# Patient Record
Sex: Male | Born: 1960 | Race: Black or African American | Hispanic: No | Marital: Married | Smoking: Current some day smoker
Health system: Southern US, Community
[De-identification: ages and names within clinical notes are randomized; demographics above are authoritative.]

## PROBLEM LIST (undated history)

## (undated) DIAGNOSIS — M1990 Unspecified osteoarthritis, unspecified site: Secondary | ICD-10-CM

## (undated) DIAGNOSIS — E785 Hyperlipidemia, unspecified: Secondary | ICD-10-CM

## (undated) HISTORY — PX: ARTHOSCOPIC ROTAOR CUFF REPAIR: SHX5002

## (undated) HISTORY — DX: Hyperlipidemia, unspecified: E78.5

## (undated) HISTORY — DX: Unspecified osteoarthritis, unspecified site: M19.90

---

## 2000-05-22 ENCOUNTER — Encounter: Payer: Self-pay | Admitting: Orthopedic Surgery

## 2000-05-22 ENCOUNTER — Encounter: Admission: RE | Admit: 2000-05-22 | Discharge: 2000-05-22 | Payer: Self-pay | Admitting: Orthopedic Surgery

## 2001-05-01 ENCOUNTER — Encounter: Payer: Self-pay | Admitting: Emergency Medicine

## 2001-05-01 ENCOUNTER — Emergency Department (HOSPITAL_COMMUNITY): Admission: EM | Admit: 2001-05-01 | Discharge: 2001-05-02 | Payer: Self-pay | Admitting: Emergency Medicine

## 2001-05-02 ENCOUNTER — Emergency Department (HOSPITAL_COMMUNITY): Admission: EM | Admit: 2001-05-02 | Discharge: 2001-05-02 | Payer: Self-pay | Admitting: Emergency Medicine

## 2005-09-05 ENCOUNTER — Encounter: Admission: RE | Admit: 2005-09-05 | Discharge: 2005-09-05 | Payer: Self-pay | Admitting: Orthopedic Surgery

## 2005-09-07 ENCOUNTER — Encounter: Admission: RE | Admit: 2005-09-07 | Discharge: 2005-09-07 | Payer: Self-pay | Admitting: Orthopedic Surgery

## 2005-10-18 ENCOUNTER — Emergency Department (HOSPITAL_COMMUNITY): Admission: EM | Admit: 2005-10-18 | Discharge: 2005-10-18 | Payer: Self-pay | Admitting: Emergency Medicine

## 2006-01-10 ENCOUNTER — Ambulatory Visit (HOSPITAL_COMMUNITY): Admission: RE | Admit: 2006-01-10 | Discharge: 2006-01-10 | Payer: Self-pay | Admitting: Orthopedic Surgery

## 2010-11-17 ENCOUNTER — Encounter: Payer: Self-pay | Admitting: Family Medicine

## 2011-06-19 ENCOUNTER — Ambulatory Visit (INDEPENDENT_AMBULATORY_CARE_PROVIDER_SITE_OTHER): Payer: PRIVATE HEALTH INSURANCE | Admitting: Internal Medicine

## 2011-06-19 ENCOUNTER — Other Ambulatory Visit (INDEPENDENT_AMBULATORY_CARE_PROVIDER_SITE_OTHER): Payer: PRIVATE HEALTH INSURANCE

## 2011-06-19 ENCOUNTER — Other Ambulatory Visit: Payer: Self-pay | Admitting: Internal Medicine

## 2011-06-19 ENCOUNTER — Encounter: Payer: Self-pay | Admitting: Internal Medicine

## 2011-06-19 VITALS — BP 120/80 | HR 83 | Temp 98.7°F | Resp 16 | Ht 68.0 in | Wt 182.8 lb

## 2011-06-19 DIAGNOSIS — R5383 Other fatigue: Secondary | ICD-10-CM | POA: Insufficient documentation

## 2011-06-19 DIAGNOSIS — Z23 Encounter for immunization: Secondary | ICD-10-CM

## 2011-06-19 DIAGNOSIS — Z0000 Encounter for general adult medical examination without abnormal findings: Secondary | ICD-10-CM

## 2011-06-19 LAB — COMPREHENSIVE METABOLIC PANEL
ALT: 33 U/L (ref 0–53)
AST: 28 U/L (ref 0–37)
Albumin: 4.3 g/dL (ref 3.5–5.2)
CO2: 28 mEq/L (ref 19–32)
Calcium: 9.3 mg/dL (ref 8.4–10.5)
Chloride: 103 mEq/L (ref 96–112)
GFR: 101.87 mL/min (ref >60.00)
Potassium: 4.3 mEq/L (ref 3.5–5.1)
Sodium: 138 mEq/L (ref 135–145)
Total Protein: 7.3 g/dL (ref 6.0–8.3)

## 2011-06-19 LAB — CBC WITH DIFFERENTIAL/PLATELET
Eosinophils Relative: 2.2 % (ref 0.0–5.0)
HCT: 39.9 % (ref 39.0–52.0)
Lymphocytes Relative: 23.4 % (ref 12.0–46.0)
Monocytes Relative: 9.7 % (ref 3.0–12.0)
Neutrophils Relative %: 64.3 % (ref 43.0–77.0)
Platelets: 281 10*3/uL (ref 150.0–400.0)
WBC: 5.7 10*3/uL (ref 4.5–10.5)

## 2011-06-19 LAB — LIPID PANEL
HDL: 43.2 mg/dL (ref >39.00)
Total CHOL/HDL Ratio: 6
Triglycerides: 398 mg/dL — ABNORMAL HIGH (ref 0.0–149.0)

## 2011-06-19 LAB — LDL CHOLESTEROL, DIRECT: Direct LDL: 123 mg/dL

## 2011-06-19 NOTE — Assessment & Plan Note (Signed)
Check labs to look for secondary causes

## 2011-06-19 NOTE — Progress Notes (Signed)
Subjective:    Patient ID: Alec Chavez, male    DOB: 10/29/1960, 50 y.o.   MRN: [id number]  HPI New to me for a complete physical, he complains of fatigue.   Review of Systems  Constitutional: Positive for fatigue. Negative for fever, chills, diaphoresis, activity change, appetite change and unexpected weight change.  HENT: Negative.   Eyes: Negative.   Respiratory: Negative for apnea, cough, choking, chest tightness, shortness of breath and stridor.   Cardiovascular: Negative for chest pain, palpitations and leg swelling.  Gastrointestinal: Negative for nausea, vomiting, abdominal pain, diarrhea, constipation, blood in stool, abdominal distention, anal bleeding and rectal pain.  Genitourinary: Negative for dysuria, urgency, frequency, hematuria, flank pain, decreased urine volume, discharge, penile swelling, scrotal swelling, enuresis, difficulty urinating, genital sores, penile pain and testicular pain.  Musculoskeletal: Negative for myalgias, back pain, joint swelling, arthralgias and gait problem.  Skin: Negative for color change, pallor, rash and wound.  Neurological: Negative.   Hematological: Negative for adenopathy. Does not bruise/bleed easily.  Psychiatric/Behavioral: Negative.        Objective:   Physical Exam  Vitals reviewed. Constitutional: He is oriented to person, place, and time. He appears well-developed and well-nourished. No distress.  HENT:  Head: Normocephalic and atraumatic.  Right Ear: External ear normal.  Left Ear: External ear normal.  Nose: Nose normal.  Mouth/Throat: Oropharynx is clear and moist. No oropharyngeal exudate.  Eyes: Conjunctivae and EOM are normal. Pupils are equal, round, and reactive to light. Right eye exhibits no discharge. Left eye exhibits no discharge. No scleral icterus.  Neck: Normal range of motion. Neck supple. No JVD present. No tracheal deviation present. No thyromegaly present.  Cardiovascular: Normal rate, regular rhythm,  normal heart sounds and intact distal pulses.  Exam reveals no gallop and no friction rub.   No murmur heard. Pulmonary/Chest: Effort normal and breath sounds normal. No stridor. No respiratory distress. He has no wheezes. He has no rales. He exhibits no tenderness.  Abdominal: Soft. Bowel sounds are normal. He exhibits no distension and no mass. There is no tenderness. There is no rebound and no guarding. Hernia confirmed negative in the right inguinal area and confirmed negative in the left inguinal area.  Genitourinary: Rectum normal, prostate normal, testes normal and penis normal. Rectal exam shows no external hemorrhoid, no internal hemorrhoid, no fissure, no mass, no tenderness and anal tone normal. Guaiac negative stool. Prostate is not enlarged and not tender. Right testis shows no mass, no swelling and no tenderness. Right testis is descended. Cremasteric reflex is not absent on the right side. Left testis shows no mass, no swelling and no tenderness. Left testis is descended. Cremasteric reflex is not absent on the left side. Circumcised. No penile erythema or penile tenderness. No discharge found.  Musculoskeletal: Normal range of motion. He exhibits no edema and no tenderness.  Lymphadenopathy:    He has no cervical adenopathy.       Right: No inguinal adenopathy present.       Left: No inguinal adenopathy present.  Neurological: He is alert and oriented to person, place, and time. He has normal reflexes. He displays normal reflexes. No cranial nerve deficit. He exhibits normal muscle tone. Coordination normal.  Skin: Skin is warm and dry. No rash noted. He is not diaphoretic. No erythema. No pallor.  Psychiatric: He has a normal mood and affect. His behavior is normal. Judgment and thought content normal.          Assessment & Plan:

## 2011-06-19 NOTE — Assessment & Plan Note (Signed)
Exam done, labs ordered, and pt ed material was given 

## 2011-06-19 NOTE — Patient Instructions (Signed)
Health Maintenance in Males MAINTAIN REGULAR HEALTH EXAMS  Maintain a healthy diet and normal weight. Increased weight leads to problems with blood pressure and diabetes. Decrease fat in the diet and increase exercise. Obtain a proper diet from your caregiver if necessary.   High blood pressure causes heart and blood vessel problems. Check blood pressures regularly and keep your blood pressure at normal limits. Aerobic exercise helps this. Persistent elevations of blood pressure should be treated with medications if weight loss and exercise are ineffective.   Avoid smoking, drinking in excess (more than 2 drinks per day), or use of street drugs. Do not share needles with anyone. Ask for help if you need assistance or instructions on stopping the use of alcohol, cigarettes, or drugs.   Maintain normal blood lipids and cholesterol. Your caregiver can give you information to lower your risk of heart disease or stroke.   Ask your caregiver if you are in need of early heart disease screening because of a strong family history of heart disease or signs of elevated testosterone (male sex hormone) levels. These can predispose you to early heart disease.   Practice safe sex. Practicing safe sex decreases your risk for a sexually transmitted infection (STI). Some of the STIs are gonorrhea, chlamydia, syphilis, trichimonas, herpes, human papillomavirus (HPV), and human immunodeficiency virus (HIV). Herpes, HIV, and HPV are viral illnesses that have no cure. These can result in disability, cancer, and death.   It is not safe for someone who has AIDS or is HIV positive to have unprotected sex with a partner who is HIV positive. The reason for this is the fact that there are many different strains of HIV. If you have a strain that is readily treated with medications and then suddenly introduce a strain from a partner that has no further treatment options, you may suddenly have a strain of HIV that is untreatable.  Even if you are both positive for HIV, it is still necessary to practice safe sex.   Use sunscreen with a SPF of 15 or greater. Being outside in the sun when your shadow caused by the sun is shorter than you are, means you are being exposed to sun at greater intensity. Lighter skinned people are at a greater risk of skin cancer.   Keep carbon monoxide and smoke detectors in your home and functioning at all times. Change the batteries every 6 months.   Do monthly examinations of your testicles. The best time to do this is after a hot shower or bath when the tissues are loose. Notify your caregivers of any lumps, tenderness, or changes in size or shape.   Notify your caregiver of new moles or changes in moles, especially if there is a change in shape or color. Also notify your caregiver if a mole is larger than the size of a pencil eraser.   Stay current with your tetanus shots and other required immunizations.  The Body Mass Index (BMI) is a way of measuring how much of your body is fat. Having a BMI above 27 increases the risk of heart disease, diabetes, hypertension, stroke, and other problems related to obesity. Document Released: 04/10/2008 Document Re-Released: 04/02/2010 [employer] Patient Information 2011 [employer]. 

## 2011-06-20 ENCOUNTER — Encounter: Payer: Self-pay | Admitting: Internal Medicine

## 2011-06-20 LAB — TESTOSTERONE, FREE, TOTAL, SHBG
Testosterone-% Free: 2.2 % (ref 1.6–2.9)
Testosterone: 358.76 ng/dL (ref 250–890)

## 2011-08-21 ENCOUNTER — Ambulatory Visit (INDEPENDENT_AMBULATORY_CARE_PROVIDER_SITE_OTHER): Payer: PRIVATE HEALTH INSURANCE | Admitting: Internal Medicine

## 2011-08-21 ENCOUNTER — Encounter: Payer: Self-pay | Admitting: Internal Medicine

## 2011-08-21 VITALS — BP 120/78 | HR 76 | Temp 97.7°F | Resp 16 | Wt 182.0 lb

## 2011-08-21 DIAGNOSIS — E782 Mixed hyperlipidemia: Secondary | ICD-10-CM

## 2011-08-21 MED ORDER — OMEGA-3-ACID ETHYL ESTERS 1 G PO CAPS: 2.0000 g | ORAL_CAPSULE | Freq: Two times a day (BID) | ORAL | Status: DC

## 2011-08-21 NOTE — Progress Notes (Signed)
  Subjective:    Patient ID: Alec Chavez, male    DOB: 1961/01/11, 50 y.o.   MRN: [id number]  Hyperlipidemia This is a new problem. This is a new diagnosis. The problem is uncontrolled. Recent lipid tests were reviewed and are variable. He has no history of chronic renal disease, diabetes, hypothyroidism, liver disease, obesity or nephrotic syndrome. Factors aggravating his hyperlipidemia include no known factors. Pertinent negatives include no chest pain, focal sensory loss, focal weakness, leg pain, myalgias or shortness of breath. He is currently on no antihyperlipidemic treatment. There are no compliance problems.       Review of Systems  Constitutional: Negative.   HENT: Negative.   Eyes: Negative.   Respiratory: Negative.  Negative for shortness of breath.   Cardiovascular: Negative.  Negative for chest pain.  Gastrointestinal: Negative.   Genitourinary: Negative.   Musculoskeletal: Negative.  Negative for myalgias.  Skin: Negative.   Neurological: Negative.  Negative for focal weakness.  Hematological: Negative.   Psychiatric/Behavioral: Negative.        Objective:   Physical Exam  Vitals reviewed. Constitutional: He appears well-developed and well-nourished. No distress.  HENT:  Head: Normocephalic and atraumatic.  Mouth/Throat: Oropharynx is clear and moist. No oropharyngeal exudate.  Eyes: Conjunctivae are normal. Right eye exhibits no discharge. Left eye exhibits no discharge. No scleral icterus.  Neck: Normal range of motion. Neck supple. No JVD present. No tracheal deviation present. No thyromegaly present.  Cardiovascular: Normal rate, regular rhythm, normal heart sounds and intact distal pulses.  Exam reveals no gallop and no friction rub.   No murmur heard. Pulmonary/Chest: Effort normal and breath sounds normal. No stridor. No respiratory distress. He has no wheezes. He has no rales. He exhibits no tenderness.  Abdominal: Soft. Bowel sounds are normal. He exhibits  no distension and no mass. There is no tenderness. There is no rebound and no guarding.  Musculoskeletal: Normal range of motion. He exhibits no edema and no tenderness.  Lymphadenopathy:    He has no cervical adenopathy.  Skin: Skin is warm and dry. No rash noted. He is not diaphoretic. No erythema. No pallor.      Lab Results  Component Value Date   WBC 5.7 06/19/2011   HGB 13.7 06/19/2011   HCT 39.9 06/19/2011   PLT 281.0 06/19/2011   GLUCOSE 104* 06/19/2011   CHOL 255* 06/19/2011   TRIG 398.0* 06/19/2011   HDL 43.20 06/19/2011   LDLDIRECT 123.0 06/19/2011   ALT 33 06/19/2011   AST 28 06/19/2011   NA 138 06/19/2011   K 4.3 06/19/2011   CL 103 06/19/2011   CREATININE 1.0 06/19/2011   BUN 15 06/19/2011   CO2 28 06/19/2011   TSH 2.16 06/19/2011   PSA 0.76 06/19/2011      Assessment & Plan:

## 2011-08-21 NOTE — Assessment & Plan Note (Signed)
Start lovaza, work on diet and lifestyle modifications, recheck trigs level in 2-3 months

## 2011-08-21 NOTE — Patient Instructions (Signed)
Hypertriglyceridemia  Diet for High blood levels of Triglycerides Most fats in food are triglycerides. Triglycerides in your blood are stored as fat in your body. High levels of triglycerides in your blood may put you at a greater risk for heart disease and stroke.  Normal triglyceride levels are less than 150 mg/dL. Borderline high levels are 150-199 mg/dl. High levels are 200 - 499 mg/dL, and very high triglyceride levels are greater than 500 mg/dL. The decision to treat high triglycerides is generally based on the level. For people with borderline or high triglyceride levels, treatment includes weight loss and exercise. Drugs are recommended for people with very high triglyceride levels. Many people who need treatment for high triglyceride levels have metabolic syndrome. This syndrome is a collection of disorders that often include: insulin resistance, high blood pressure, blood clotting problems, high cholesterol and triglycerides. TESTING PROCEDURE FOR TRIGLYCERIDES  You should not eat 4 hours before getting your triglycerides measured. The normal range of triglycerides is between 10 and 250 milligrams per deciliter (mg/dl). Some people may have extreme levels (1000 or above), but your triglyceride level may be too high if it is above 150 mg/dl, depending on what other risk factors you have for heart disease.   People with high blood triglycerides may also have high blood cholesterol levels. If you have high blood cholesterol as well as high blood triglycerides, your risk for heart disease is probably greater than if you only had high triglycerides. High blood cholesterol is one of the main risk factors for heart disease.  CHANGING YOUR DIET  Your weight can affect your blood triglyceride level. If you are more than 20% above your ideal body weight, you may be able to lower your blood triglycerides by losing weight. Eating less and exercising regularly is the best way to combat this. Fat provides  more calories than any other food. The best way to lose weight is to eat less fat. Only 30% of your total calories should come from fat. Less than 7% of your diet should come from saturated fat. A diet low in fat and saturated fat is the same as a diet to decrease blood cholesterol. By eating a diet lower in fat, you may lose weight, lower your blood cholesterol, and lower your blood triglyceride level.  Eating a diet low in fat, especially saturated fat, may also help you lower your blood triglyceride level. Ask your dietitian to help you figure how much fat you can eat based on the number of calories your caregiver has prescribed for you.  Exercise, in addition to helping with weight loss may also help lower triglyceride levels.   Alcohol can increase blood triglycerides. You may need to stop drinking alcoholic beverages.   Too much carbohydrate in your diet may also increase your blood triglycerides. Some complex carbohydrates are necessary in your diet. These may include bread, rice, potatoes, other starchy vegetables and cereals.   Reduce "simple" carbohydrates. These may include pure sugars, candy, honey, and jelly without losing other nutrients. If you have the kind of high blood triglycerides that is affected by the amount of carbohydrates in your diet, you will need to eat less sugar and less high-sugar foods. Your caregiver can help you with this.   Adding 2-4 grams of fish oil (EPA+ DHA) may also help lower triglycerides. Speak with your caregiver before adding any supplements to your regimen.  Following the Diet  Maintain your ideal weight. Your caregivers can help you with a diet. Generally,   eating less food and getting more exercise will help you lose weight. Joining a weight control group may also help. Ask your caregivers for a good weight control group in your area.  Eat low-fat foods instead of high-fat foods. This can help you lose weight too.  These foods are lower in fat. Eat MORE  of these:   Dried beans, peas, and lentils.   Egg whites.   Low-fat cottage cheese.   Fish.   Lean cuts of meat, such as round, sirloin, rump, and flank (cut extra fat off meat you fix).   Whole grain breads, cereals and pasta.   Skim and nonfat dry milk.   Low-fat yogurt.   Poultry without the skin.   Cheese made with skim or part-skim milk, such as mozzarella, parmesan, farmers', ricotta, or pot cheese.  These are higher fat foods. Eat LESS of these:   Whole milk and foods made from whole milk, such as American, blue, cheddar, monterey jack, and swiss cheese   High-fat meats, such as luncheon meats, sausages, knockwurst, bratwurst, hot dogs, ribs, corned beef, ground pork, and regular ground beef.   Fried foods.  Limit saturated fats in your diet. Substituting unsaturated fat for saturated fat may decrease your blood triglyceride level. You will need to read package labels to know which products contain saturated fats.  These foods are high in saturated fat. Eat LESS of these:   Fried pork skins.   Whole milk.   Skin and fat from poultry.   Palm oil.   Butter.   Shortening.   Cream cheese.   Bacon.   Margarines and baked goods made from listed oils.   Vegetable shortenings.   Chitterlings.   Fat from meats.   Coconut oil.   Palm kernel oil.   Lard.   Cream.   Sour cream.   Fatback.   Coffee whiteners and non-dairy creamers made with these oils.   Cheese made from whole milk.  Use unsaturated fats (both polyunsaturated and monounsaturated) moderately. Remember, even though unsaturated fats are better than saturated fats; you still want a diet low in total fat.  These foods are high in unsaturated fat:   Canola oil.   Sunflower oil.   Mayonnaise.   Almonds.   Peanuts.   Pine nuts.   Margarines made with these oils.   Safflower oil.   Olive oil.   Avocados.   Cashews.   Peanut butter.   Sunflower seeds.   Soybean oil.     Peanut oil.   Olives.   Pecans.   Walnuts.   Pumpkin seeds.  Avoid sugar and other high-sugar foods. This will decrease carbohydrates without decreasing other nutrients. Sugar in your food goes rapidly to your blood. When there is excess sugar in your blood, your liver may use it to make more triglycerides. Sugar also contains calories without other important nutrients.  Eat LESS of these:   Sugar, brown sugar, powdered sugar, jam, jelly, preserves, honey, syrup, molasses, pies, candy, cakes, cookies, frosting, pastries, colas, soft drinks, punches, fruit drinks, and regular gelatin.   Avoid alcohol. Alcohol, even more than sugar, may increase blood triglycerides. In addition, alcohol is high in calories and low in nutrients. Ask for sparkling water, or a diet soft drink instead of an alcoholic beverage.  Suggestions for planning and preparing meals   Bake, broil, grill or roast meats instead of frying.   Remove fat from meats and skin from poultry before cooking.   Add spices,   herbs, lemon juice or vinegar to vegetables instead of salt, rich sauces or gravies.   Use a non-stick skillet without fat or use no-stick sprays.   Cool and refrigerate stews and broth. Then remove the hardened fat floating on the surface before serving.   Refrigerate meat drippings and skim off fat to make low-fat gravies.   Serve more fish.   Use less butter, margarine and other high-fat spreads on bread or vegetables.   Use skim or reconstituted non-fat dry milk for cooking.   Cook with low-fat cheeses.   Substitute low-fat yogurt or cottage cheese for all or part of the sour cream in recipes for sauces, dips or congealed salads.   Use half yogurt/half mayonnaise in salad recipes.   Substitute evaporated skim milk for cream. Evaporated skim milk or reconstituted non-fat dry milk can be whipped and substituted for whipped cream in certain recipes.   Choose fresh fruits for dessert instead of  high-fat foods such as pies or cakes. Fruits are naturally low in fat.  When Dining Out   Order low-fat appetizers such as fruit or vegetable juice, pasta with vegetables or tomato sauce.   Select clear, rather than cream soups.   Ask that dressings and gravies be served on the side. Then use less of them.   Order foods that are baked, broiled, poached, steamed, stir-fried, or roasted.   Ask for margarine instead of butter, and use only a small amount.   Drink sparkling water, unsweetened tea or coffee, or diet soft drinks instead of alcohol or other sweet beverages.  QUESTIONS AND ANSWERS ABOUT OTHER FATS IN THE BLOOD: SATURATED FAT, TRANS FAT, AND CHOLESTEROL What is trans fat? Trans fat is a type of fat that is formed when vegetable oil is hardened through a process called hydrogenation. This process helps makes foods more solid, gives them shape, and prolongs their shelf life. Trans fats are also called hydrogenated or partially hydrogenated oils.  What do saturated fat, trans fat, and cholesterol in foods have to do with heart disease? Saturated fat, trans fat, and cholesterol in the diet all raise the level of LDL "bad" cholesterol in the blood. The higher the LDL cholesterol, the greater the risk for coronary heart disease (CHD). Saturated fat and trans fat raise LDL similarly.  What foods contain saturated fat, trans fat, and cholesterol? High amounts of saturated fat are found in animal products, such as fatty cuts of meat, chicken skin, and full-fat dairy products like butter, whole milk, cream, and cheese, and in tropical vegetable oils such as palm, palm kernel, and coconut oil. Trans fat is found in some of the same foods as saturated fat, such as vegetable shortening, some margarines (especially hard or stick margarine), crackers, cookies, baked goods, fried foods, salad dressings, and other processed foods made with partially hydrogenated vegetable oils. Small amounts of trans fat  also occur naturally in some animal products, such as milk products, beef, and lamb. Foods high in cholesterol include liver, other organ meats, egg yolks, shrimp, and full-fat dairy products. How can I use the new food label to make heart-healthy food choices? Check the Nutrition Facts panel of the food label. Choose foods lower in saturated fat, trans fat, and cholesterol. For saturated fat and cholesterol, you can also use the Percent Daily Value (%DV): 5% DV or less is low, and 20% DV or more is high. (There is no %DV for trans fat.) Use the Nutrition Facts panel to choose foods low in   saturated fat and cholesterol, and if the trans fat is not listed, read the ingredients and limit products that list shortening or hydrogenated or partially hydrogenated vegetable oil, which tend to be high in trans fat. POINTS TO REMEMBER: YOU NEED A LITTLE TLC (THERAPEUTIC LIFESTYLE CHANGES)  Discuss your risk for heart disease with your caregivers, and take steps to reduce risk factors.   Change your diet. Choose foods that are low in saturated fat, trans fat, and cholesterol.   Add exercise to your daily routine if it is not already being done. Participate in physical activity of moderate intensity, like brisk walking, for at least 30 minutes on most, and preferably all days of the week. No time? Break the 30 minutes into three, 10-minute segments during the day.   Stop smoking. If you do smoke, contact your caregiver to discuss ways in which they can help you quit.   Do not use street drugs.   Maintain a normal weight.   Maintain a healthy blood pressure.   Keep up with your blood work for checking the fats in your blood as directed by your caregiver.  Document Released: 07/31/2004 Document Revised: 06/25/2011 Document Reviewed: 02/26/2009 [employer] Patient Information 2012 [employer]. 

## 2012-09-09 ENCOUNTER — Ambulatory Visit (INDEPENDENT_AMBULATORY_CARE_PROVIDER_SITE_OTHER): Payer: PRIVATE HEALTH INSURANCE | Admitting: Emergency Medicine

## 2012-09-09 ENCOUNTER — Ambulatory Visit: Payer: PRIVATE HEALTH INSURANCE

## 2012-09-09 VITALS — BP 126/88 | HR 86 | Temp 98.2°F | Resp 18 | Ht 68.0 in | Wt 182.8 lb

## 2012-09-09 DIAGNOSIS — M25519 Pain in unspecified shoulder: Secondary | ICD-10-CM

## 2012-09-09 MED ORDER — MELOXICAM 15 MG PO TABS: 15.0000 mg | ORAL_TABLET | Freq: Every day | ORAL | Status: DC

## 2012-09-09 NOTE — Progress Notes (Signed)
[facility] [address] [place] [address] [phone]  Date:  09/09/2012   Name:  Alec Chavez   DOB:  1961/10/12   MRN:  [id number]  PCP:  Sanda Linger, MD    Chief Complaint: Shoulder Pain   History of Present Illness:  Alec Chavez is a 51 y.o. very pleasant male patient who presents with the following:  No history of recent injury or overuse.  Has pain in both shoulders.  Had 'shoulder scraping' of right shoulder to remove calcium deposits 10 years ago.  Now noticed a "lump" in right shoulder and pain in both shoulders that prevent him from laying on either shoulder and sometime interfere with his carrying minimal weights like wine bottles.  Denies crepitus or other complaints.   No limitiation of motion actively most of the time.  Patient Active Problem List  Diagnosis  . Fatigue  . Routine general medical examination at a health care facility  . Mixed hyperlipidemia    Past Medical History  Diagnosis Date  . Hyperlipidemia     Past Surgical History  Procedure Date  . Arthoscopic rotaor cuff repair     right shoulder    History  Substance Use Topics  . Smoking status: Never Smoker   . Smokeless tobacco: Not on file  . Alcohol Use: No    Family History  Problem Relation Age of Onset  . Cancer Neg Hx   . Heart disease Neg Hx   . Hyperlipidemia Neg Hx   . Hypertension Neg Hx   . Diabetes Neg Hx     Allergies  Allergen Reactions  . Codeine Nausea Only    Dizziness    Medication list has been reviewed and updated.  Current Outpatient Prescriptions on File Prior to Visit  Medication Sig Dispense Refill  . aspirin 81 MG tablet Take 81 mg by mouth daily.        . COD LIVER OIL PO Take by mouth daily.        . Multiple Vitamin (MULTIVITAMIN) tablet Take 1 tablet by mouth daily.        Marland Kitchen omega-3 acid ethyl esters (LOVAZA) 1 G capsule Take 2 capsules (2 g total) by mouth 2 (two) times daily.  120 capsule  11    Review of  Systems:  As per HPI, otherwise negative.    Physical Examination: Filed Vitals:   09/09/12 1317  BP: 126/88  Pulse: 86  Temp: 98.2 F (36.8 C)  Resp: 18   Filed Vitals:   09/09/12 1317  Height: 5\' 8"  (1.727 m)  Weight: 182 lb 12.8 oz (82.918 kg)   Body mass index is 27.79 kg/(m^2). Ideal Body Weight: Weight in (lb) to have BMI = 25: 164.1    GEN: WDWN, NAD, Non-toxic, Alert & Oriented x 3 HEENT: Atraumatic, Normocephalic.  Ears and Nose: No external deformity. EXTR: No clubbing/cyanosis/edema NEURO: Normal gait.  PSYCH: Normally interactive. Conversant. Not depressed or anxious appearing.  Calm demeanor.  RIGHT SHOULDER:  Tender AC joint. Left Shoulder:  Full ROM.  Not tender  Assessment and Plan: Shoulder pain Ortho consult NSAID  Carmelina Dane, MD  [facility] reading (PRIMARY) by  Dr. Dareen Piano.  Right shoulder significant for [facility] joint calcifications.  [facility] reading (PRIMARY) by  Dr. Dareen Piano.  Left shoulder normal.

## 2012-09-17 NOTE — Progress Notes (Signed)
Reviewed and agree.

## 2012-11-24 ENCOUNTER — Ambulatory Visit: Payer: PRIVATE HEALTH INSURANCE

## 2012-11-24 ENCOUNTER — Ambulatory Visit (INDEPENDENT_AMBULATORY_CARE_PROVIDER_SITE_OTHER): Payer: PRIVATE HEALTH INSURANCE | Admitting: Internal Medicine

## 2012-11-24 VITALS — BP 120/76 | HR 98 | Temp 99.0°F | Resp 16 | Ht 70.0 in | Wt 182.0 lb

## 2012-11-24 DIAGNOSIS — R509 Fever, unspecified: Secondary | ICD-10-CM

## 2012-11-24 DIAGNOSIS — R059 Cough, unspecified: Secondary | ICD-10-CM

## 2012-11-24 DIAGNOSIS — R05 Cough: Secondary | ICD-10-CM

## 2012-11-24 DIAGNOSIS — R51 Headache: Secondary | ICD-10-CM

## 2012-11-24 DIAGNOSIS — J189 Pneumonia, unspecified organism: Secondary | ICD-10-CM

## 2012-11-24 LAB — POCT INFLUENZA A/B
Influenza A, POC: NEGATIVE
Influenza B, POC: NEGATIVE

## 2012-11-24 LAB — POCT CBC
Granulocyte percent: 70.3 %G (ref 37–80)
HCT, POC: 43 % — AB (ref 43.5–53.7)
Hemoglobin: 13.7 g/dL — AB (ref 14.1–18.1)
Lymph, poc: 2.4 (ref 0.6–3.4)
MCH, POC: 29.8 pg (ref 27–31.2)
MCHC: 31.9 g/dL (ref 31.8–35.4)
MCV: 93.7 fL (ref 80–97)
MID (cbc): 1 — AB (ref 0–0.9)
MPV: 8.4 fL (ref 0–99.8)
POC Granulocyte: 8 — AB (ref 2–6.9)
POC LYMPH PERCENT: 21.3 %L (ref 10–50)
POC MID %: 8.4 %M (ref 0–12)
Platelet Count, POC: 319 10*3/uL (ref 142–424)
RBC: 4.59 M/uL — AB (ref 4.69–6.13)
RDW, POC: 15 %
WBC: 11.4 10*3/uL — AB (ref 4.6–10.2)

## 2012-11-24 MED ORDER — HYDROCODONE-HOMATROPINE 5-1.5 MG/5ML PO SYRP: 5.0000 mL | ORAL_SOLUTION | Freq: Three times a day (TID) | ORAL | Status: DC | PRN

## 2012-11-24 MED ORDER — ALBUTEROL SULFATE HFA 108 (90 BASE) MCG/ACT IN AERS: 2.0000 | INHALATION_SPRAY | RESPIRATORY_TRACT | Status: DC | PRN

## 2012-11-24 MED ORDER — MOXIFLOXACIN HCL 400 MG PO TABS: 400.0000 mg | ORAL_TABLET | Freq: Every day | ORAL | Status: DC

## 2012-11-24 NOTE — Progress Notes (Signed)
Subjective:    Patient ID: Alec Chavez, male    DOB: [birth year], 52 y.o.   MRN: [id number]  HPI 52 year old male presents with 4 day history of fever, chills, headache, and slight dry cough.  States symptoms started suddenly and have persisted.  States he has had severe headache and has described as "the hair on his head hurting." Has taken Aleve which he does not think has helped much.  Admits to nasal congestion and cough. Denies nausea, vomiting, photophobia, neck pain/stiffness, otalgia, dizziness, or lightheadedness.  He is otherwise healthy with no other complaints today.  No known flu contacts. He did have his flu shot this year.      Review of Systems  Constitutional: Positive for fever and chills.  HENT: Positive for congestion and postnasal drip. Negative for ear pain, sore throat, rhinorrhea, neck pain, neck stiffness and ear discharge.   Respiratory: Positive for cough. Negative for chest tightness, shortness of breath and wheezing.   Cardiovascular: Negative for chest pain.  Gastrointestinal: Negative for nausea, vomiting and abdominal pain.  Neurological: Positive for headaches. Negative for dizziness and light-headedness.  All other systems reviewed and are negative.       Objective:   Physical Exam  Constitutional: He is oriented to person, place, and time. He appears well-developed and well-nourished.  HENT:  Head: Normocephalic and atraumatic.  Right Ear: Hearing, tympanic membrane, external ear and ear canal normal.  Left Ear: Hearing, tympanic membrane, external ear and ear canal normal.  Mouth/Throat: Uvula is midline, oropharynx is clear and moist and mucous membranes are normal. No oropharyngeal exudate.  Eyes: Conjunctivae normal and EOM are normal. Pupils are equal, round, and reactive to light.  Neck: Normal range of motion. Neck supple.  Cardiovascular: Normal rate, regular rhythm and normal heart sounds.   Pulmonary/Chest: Effort normal and breath sounds  normal.  Lymphadenopathy:    He has no cervical adenopathy.  Neurological: He is alert and oriented to person, place, and time.  Psychiatric: He has a normal mood and affect. His behavior is normal. Judgment and thought content normal.     Results for orders placed in visit on 11/24/12  POCT CBC      Component Value Range   WBC 11.4 (*) 4.6 - 10.2 K/uL   Lymph, poc 2.4  0.6 - 3.4   POC LYMPH PERCENT 21.3  10 - 50 %L   MID (cbc) 1.0 (*) 0 - 0.9   POC MID % 8.4  0 - 12 %M   POC Granulocyte 8.0 (*) 2 - 6.9   Granulocyte percent 70.3  37 - 80 %G   RBC 4.59 (*) 4.69 - 6.13 M/uL   Hemoglobin 13.7 (*) 14.1 - 18.1 g/dL   HCT, POC 40.9 (*) 81.1 - 53.7 %   MCV 93.7  80 - 97 fL   MCH, POC 29.8  27 - 31.2 pg   MCHC 31.9  31.8 - 35.4 g/dL   RDW, POC 91.4     Platelet Nester, POC 319  142 - 424 K/uL   MPV 8.4  0 - 99.8 fL  POCT INFLUENZA A/B      Component Value Range   Influenza A, POC Negative     Influenza B, POC Negative          [facility] reading (PRIMARY) by  Dr. Merla Riches as right lower lob infiltrate.    Ibuprofen 800 mg given today in office.   Assessment & Plan:   1. Pneumonia  moxifloxacin (AVELOX) 400 MG tablet  2. Fever  POCT CBC, POCT Influenza A/B  3. Cough  POCT CBC, DG Chest 2 View, HYDROcodone-homatropine (HYCODAN) 5-1.5 MG/5ML syrup, albuterol (PROVENTIL HFA;VENTOLIN HFA) 108 (90 BASE) MCG/ACT inhaler  4. Headache    Avelox 400 mg daily x 10 days Hycodan tid prn cough Ibuprofen and tylenol q4hours prn headache and fever Albuterol q4-6hours prn SOB Close follow up - if no improvement in 24-48 hours, recommend recheck, sooner if worse.    I have reviewed and agree with documentation. Robert P. Merla Riches, M.D.

## 2014-02-06 ENCOUNTER — Ambulatory Visit (INDEPENDENT_AMBULATORY_CARE_PROVIDER_SITE_OTHER): Payer: PRIVATE HEALTH INSURANCE | Admitting: Emergency Medicine

## 2014-02-06 VITALS — BP 120/72 | HR 73 | Temp 98.0°F | Resp 16 | Ht 68.5 in | Wt 179.0 lb

## 2014-02-06 DIAGNOSIS — M25519 Pain in unspecified shoulder: Secondary | ICD-10-CM

## 2014-02-06 DIAGNOSIS — M719 Bursopathy, unspecified: Secondary | ICD-10-CM

## 2014-02-06 DIAGNOSIS — M67919 Unspecified disorder of synovium and tendon, unspecified shoulder: Secondary | ICD-10-CM

## 2014-02-06 MED ORDER — TRAMADOL HCL 50 MG PO TABS: ORAL_TABLET | ORAL | Status: DC

## 2014-02-06 MED ORDER — PREDNISONE 10 MG PO TABS: ORAL_TABLET | ORAL | Status: DC

## 2014-02-06 NOTE — Progress Notes (Signed)
   Subjective:    Patient ID: Alec Chavez, male    DOB: 05/28/1961, 53 y.o.   MRN: [id number]  HPI patient has chronic pain in both shoulders. This has been long-standing and has not improved despite nonsteroidals and joint injections. He does his own physical therapy at home and this has not helped. He has a very physical job at a [employer] company and does have to do a lot of lifting and overhead lifting and this makes it worse. At times his arm feels weak.    Review of Systems     Objective:   Physical Exam patient is alert and cooperative he is not in distress. His neck is supple. Chest is clear to both auscultation and percussion. Heart regular rate no murmurs there is tenderness over the subdeltoid area left greater than right. He has pain with abduction of the left arm and pain with external rotation. He is weak with internal rotation and external rotation against resistance. He is also weak with empty can testing.        Assessment & Plan:  I suspect patient has long-standing rotator cuff problems. I encouraged him to at least schedule him for physical therapy. He is going to check with his insurance first. I encouraged him to get back to see his orthopedist but he is hesitant to do so because they want to do surgery. I told him there may only be a surgical answer to the problems he is experiencing. We'll try prednisone taper. He can he will call back regarding his ability to have physical therapy under his insurance.take Ultram at night.

## 2014-12-04 ENCOUNTER — Ambulatory Visit (INDEPENDENT_AMBULATORY_CARE_PROVIDER_SITE_OTHER): Payer: PRIVATE HEALTH INSURANCE

## 2014-12-04 ENCOUNTER — Ambulatory Visit (HOSPITAL_COMMUNITY)
Admission: RE | Admit: 2014-12-04 | Discharge: 2014-12-04 | Disposition: A | Discharge status: Home / Self Care | Payer: PRIVATE HEALTH INSURANCE | Source: Ambulatory Visit | Attending: Emergency Medicine | Admitting: Emergency Medicine

## 2014-12-04 ENCOUNTER — Ambulatory Visit (INDEPENDENT_AMBULATORY_CARE_PROVIDER_SITE_OTHER): Payer: PRIVATE HEALTH INSURANCE | Admitting: Emergency Medicine

## 2014-12-04 ENCOUNTER — Telehealth: Payer: Self-pay

## 2014-12-04 VITALS — BP 116/70 | HR 72 | Temp 97.8°F | Resp 18 | Ht 68.5 in | Wt 178.6 lb

## 2014-12-04 DIAGNOSIS — M7989 Other specified soft tissue disorders: Secondary | ICD-10-CM | POA: Insufficient documentation

## 2014-12-04 DIAGNOSIS — M25562 Pain in left knee: Secondary | ICD-10-CM

## 2014-12-04 MED ORDER — TRAMADOL HCL 50 MG PO TABS: ORAL_TABLET | ORAL | Status: DC

## 2014-12-04 MED ORDER — MELOXICAM 15 MG PO TABS: 15.0000 mg | ORAL_TABLET | Freq: Every day | ORAL | Status: DC

## 2014-12-04 NOTE — Progress Notes (Addendum)
Subjective:  This chart was scribed for Collene GobbleSteven A Daub, MD by Charline BillsEssence Howell, ED Scribe. The patient was seen in room 14. Patient's care was started at 12:26 PM.   Patient ID: Alec Chavez, male    DOB: 04/05/1961, 54 y.o.   MRN: [id number]  Chief Complaint  Patient presents with  . Knee Pain    L knee pain- x4 days  . Leg Swelling    Swelling in left knee and upper L leg x2 days   HPI  HPI Comments: Alec Chavez is a 54 y.o. male, with a h/o hyperlipidemia, who [facility] complaining of constant L knee pain for the past 4 days. Pain is worse behind the knee cap and is exacerbated with touching and movement. Pt reports associated swelling to L knee and L calf over the past 2 days. He does not recall hitting his knee but states that he might have hit it at work. He denies recent long travels. Pt denies h/o blood clot; pt's sister has a h/o blood clot in her knee that traveled to her heart prior to passing in 2008.  Past Medical History  Diagnosis Date  . Hyperlipidemia     Current Outpatient Prescriptions on File Prior to Visit  Medication Sig Dispense Refill  . aspirin 81 MG tablet Take 81 mg by mouth daily.      . COD LIVER OIL PO Take by mouth daily.      . Multiple Vitamin (MULTIVITAMIN) tablet Take 1 tablet by mouth daily.      Marland Kitchen. albuterol (PROVENTIL HFA;VENTOLIN HFA) 108 (90 BASE) MCG/ACT inhaler Inhale 2 puffs into the lungs every 4 (four) hours as needed for wheezing (cough, shortness of breath or wheezing.). (Patient not taking: Reported on 12/04/2014) 1 Inhaler 1  . meloxicam (MOBIC) 15 MG tablet Take 1 tablet (15 mg total) by mouth daily. (Patient not taking: Reported on 12/04/2014) 30 tablet 0  . moxifloxacin (AVELOX) 400 MG tablet Take 1 tablet (400 mg total) by mouth daily. (Patient not taking: Reported on 12/04/2014) 10 tablet 0  . omega-3 acid ethyl esters (LOVAZA) 1 G capsule Take 2 capsules (2 g total) by mouth 2 (two) times daily. 120  capsule 11  . traMADol (ULTRAM) 50 MG tablet Take one tablet at night for pain (Patient not taking: Reported on 12/04/2014) 20 tablet 0   No current facility-administered medications on file prior to visit.   Allergies  Allergen Reactions  . Codeine Nausea Only    Dizziness   Review of Systems  Musculoskeletal: Positive for joint swelling and arthralgias.      Objective:   Physical Exam CONSTITUTIONAL: Well developed/well nourished HEAD: Normocephalic/atraumatic EYES: EOMI/PERRL ENMT: Mucous membranes moist NECK: supple no meningeal signs SPINE/BACK:entire spine nontender CV: S1/S2 noted, no murmurs/rubs/gallops noted LUNGS: Lungs are clear to auscultation bilaterally, no apparent distress ABDOMEN: soft, nontender, no rebound or guarding, bowel sounds noted throughout abdomen GU:no cva tenderness NEURO: Pt is awake/alert/appropriate, moves all extremitiesx4.  No facial droop.   EXTREMITIES: pulses normal/equal, full ROM. Tender over the L medial joint space. Pain with flexion and extension. Positive for McMurray's. Tenderness over L calf with 1+ swelling. SKIN: warm, color normal PSYCH: no abnormalities of mood noted, alert and oriented to situation    [facility] reading (PRIMARY) by  Dr. Cleta Albertsaub no fracture seen. There is a radiolucent area present in the medial meniscus area please comment.  Assessment & Plan : I suspect this is  a medial cartilage injury. We'll treat with multiple lytic with Ultram for breakthrough pain. He did have a sister died from a blood clot in her leg. We'll proceed with an ultrasound of the left calf.

## 2014-12-04 NOTE — Telephone Encounter (Signed)
Call doppler normal 

## 2014-12-04 NOTE — Telephone Encounter (Signed)
Tech advised pt doppler normal.

## 2014-12-04 NOTE — Progress Notes (Signed)
Left lower extremity venous duplex completed.  Left:  No evidence of DVT, superficial thrombosis, or Baker's cyst.  Right:  Negative for DVT in the common femoral vein.  

## 2014-12-04 NOTE — Patient Instructions (Signed)
Appt is set for 2pm today.  Please Arrive 15 mins before time [facility] and Register as outpatient in Admitting

## 2014-12-04 NOTE — Telephone Encounter (Signed)
Call report: Doppler negative.

## 2015-10-13 ENCOUNTER — Ambulatory Visit (INDEPENDENT_AMBULATORY_CARE_PROVIDER_SITE_OTHER): Payer: BLUE CROSS/BLUE SHIELD | Admitting: Family Medicine

## 2015-10-13 VITALS — BP 144/90 | HR 74 | Temp 98.6°F | Resp 16 | Ht 67.5 in | Wt 166.6 lb

## 2015-10-13 DIAGNOSIS — IMO0001 Reserved for inherently not codable concepts without codable children: Secondary | ICD-10-CM

## 2015-10-13 DIAGNOSIS — Z125 Encounter for screening for malignant neoplasm of prostate: Secondary | ICD-10-CM

## 2015-10-13 DIAGNOSIS — E785 Hyperlipidemia, unspecified: Secondary | ICD-10-CM | POA: Diagnosis not present

## 2015-10-13 DIAGNOSIS — Z114 Encounter for screening for human immunodeficiency virus [HIV]: Secondary | ICD-10-CM

## 2015-10-13 DIAGNOSIS — K4090 Unilateral inguinal hernia, without obstruction or gangrene, not specified as recurrent: Secondary | ICD-10-CM | POA: Diagnosis not present

## 2015-10-13 DIAGNOSIS — Z0000 Encounter for general adult medical examination without abnormal findings: Secondary | ICD-10-CM

## 2015-10-13 DIAGNOSIS — Z131 Encounter for screening for diabetes mellitus: Secondary | ICD-10-CM | POA: Diagnosis not present

## 2015-10-13 DIAGNOSIS — Z1211 Encounter for screening for malignant neoplasm of colon: Secondary | ICD-10-CM

## 2015-10-13 DIAGNOSIS — R030 Elevated blood-pressure reading, without diagnosis of hypertension: Secondary | ICD-10-CM

## 2015-10-13 DIAGNOSIS — R319 Hematuria, unspecified: Secondary | ICD-10-CM | POA: Diagnosis not present

## 2015-10-13 DIAGNOSIS — M25512 Pain in left shoulder: Secondary | ICD-10-CM | POA: Diagnosis not present

## 2015-10-13 DIAGNOSIS — M25511 Pain in right shoulder: Secondary | ICD-10-CM

## 2015-10-13 LAB — POCT URINALYSIS DIP (MANUAL ENTRY)
BILIRUBIN UA: NEGATIVE
Glucose, UA: NEGATIVE
Ketones, POC UA: NEGATIVE
LEUKOCYTES UA: NEGATIVE
NITRITE UA: NEGATIVE
PH UA: 7
Protein Ur, POC: NEGATIVE
Spec Grav, UA: 1.02
UROBILINOGEN UA: 0.2

## 2015-10-13 LAB — POC MICROSCOPIC URINALYSIS (UMFC): MUCUS RE: ABSENT

## 2015-10-13 NOTE — Patient Instructions (Signed)

## 2015-10-13 NOTE — Progress Notes (Signed)
Subjective:    Patient ID: Alec Chavez, male    DOB: 1960-11-12, 54 y.o.   MRN: [id number]  10/13/2015  Annual Exam and possible hernia   HPI This 54 y.o. male presents for Complete Physical Examination.  Last physical: 06/19/2011 Dr. Sanda Linger [facility] Colonoscopy:  never TDAP: 2012 Influenza: work; 2016 Eye exam:  Yearly; +glasses; no cataracts or glaucoma Dental exam:  Several years ago.  B shoulder pain/tendonitis: having chronic shoulder pain. Requesting B shoulder injections.   Having a lot of stiffness; intermittent pain at this time.  Last evaluation by ortho in 2013.  S/p physical therapy in the past.  Shoulders are weak.  Nighttime pain in B shoulders in past year or two.    Hernia noticed June or July 2016.  No pain.   Review of Systems  Constitutional: Negative for fever, chills, diaphoresis, activity change, appetite change, fatigue and unexpected weight change.  HENT: Negative for congestion, dental problem, drooling, ear discharge, ear pain, facial swelling, hearing loss, mouth sores, nosebleeds, postnasal drip, rhinorrhea, sinus pressure, sneezing, sore throat, tinnitus, trouble swallowing and voice change.   Eyes: Negative for photophobia, pain, discharge, redness, itching and visual disturbance.  Respiratory: Negative for apnea, cough, choking, chest tightness, shortness of breath, wheezing and stridor.   Cardiovascular: Negative for chest pain, palpitations and leg swelling.  Gastrointestinal: Negative for nausea, vomiting, abdominal pain, diarrhea, constipation and blood in stool.  Endocrine: Negative for cold intolerance, heat intolerance, polydipsia, polyphagia and polyuria.  Genitourinary: Negative for dysuria, urgency, frequency, hematuria, flank pain, decreased urine volume, discharge, penile swelling, scrotal swelling, enuresis, difficulty urinating, genital sores, penile pain and testicular pain.  Musculoskeletal: Negative for myalgias, back pain, joint  swelling, arthralgias, gait problem, neck pain and neck stiffness.  Skin: Negative for color change, pallor, rash and wound.  Allergic/Immunologic: Negative for environmental allergies, food allergies and immunocompromised state.  Neurological: Negative for dizziness, tremors, seizures, syncope, facial asymmetry, speech difficulty, weakness, light-headedness, numbness and headaches.  Hematological: Negative for adenopathy. Does not bruise/bleed easily.  Psychiatric/Behavioral: Negative for suicidal ideas, hallucinations, behavioral problems, confusion, sleep disturbance, self-injury, dysphoric mood, decreased concentration and agitation. The patient is not nervous/anxious and is not hyperactive.     Past Medical History  Diagnosis Date  . Hyperlipidemia   . Arthritis     Shoulders B   Past Surgical History  Procedure Laterality Date  . Arthoscopic rotaor cuff repair      right shoulder   Allergies  Allergen Reactions  . Codeine Nausea Only    Dizziness    Social History   Social History  . Marital Status: Married    Spouse Name: N/A  . Number of Children: N/A  . Years of Education: N/A   Occupational History  . Restaurant manager, fast food    Social History Main Topics  . Smoking status: Never Smoker   . Smokeless tobacco: Not on file  . Alcohol Use: Yes  . Drug Use: No  . Sexual Activity: Yes   Other Topics Concern  . Not on file   Social History Narrative   Marital status: married since 01/2015; second marriage      Children: none      Lives: with wife      Employment: Restaurant manager, fast food for [employer]      Tobacco; none      Alcohol: 7 beers per week; one DUI in 1990s      Drugs: none      Exercise:  Physical  job.      Seatbelt: 100% of time     Caffienated drinks-no      Smoke alarm in the home-yes     firearms/guns in the home-no   History of physical abuse-no               Family History  Problem Relation Age of Onset  . Cancer Neg Hx   . Hyperlipidemia Neg  Hx   . Hypertension Neg Hx   . Diabetes Neg Hx   . Heart disease Mother 26    AMI  . Arthritis Father     DDD lumbar spine       Objective:    BP 144/90 mmHg  Pulse 74  Temp(Src) 98.6 F (37 C) (Oral)  Resp 16  Ht 5' 7.5" (1.715 m)  Wt 166 lb 9.6 oz (75.569 kg)  BMI 25.69 kg/m2  SpO2 98% Physical Exam  Constitutional: He is oriented to person, place, and time. He appears well-developed and well-nourished. No distress.  HENT:  Head: Normocephalic and atraumatic.  Right Ear: External ear normal.  Left Ear: External ear normal.  Nose: Nose normal.  Mouth/Throat: Oropharynx is clear and moist.  Eyes: Conjunctivae and EOM are normal. Pupils are equal, round, and reactive to light.  Neck: Normal range of motion. Neck supple. Carotid bruit is not present. No thyromegaly present.  Cardiovascular: Normal rate, regular rhythm, normal heart sounds and intact distal pulses.  Exam reveals no gallop and no friction rub.   No murmur heard. Pulmonary/Chest: Effort normal and breath sounds normal. He has no wheezes. He has no rales.  Abdominal: Soft. Bowel sounds are normal. He exhibits no distension and no mass. There is no tenderness. There is no rebound and no guarding. A hernia is present. Hernia confirmed positive in the left inguinal area.  Genitourinary: Testes normal.  Musculoskeletal:       Right shoulder: Normal.       Left shoulder: Normal.       Cervical back: Normal.  Lymphadenopathy:    He has no cervical adenopathy.  Neurological: He is alert and oriented to person, place, and time. He has normal reflexes. No cranial nerve deficit. He exhibits normal muscle tone. Coordination normal.  Skin: Skin is warm and dry. No rash noted. He is not diaphoretic.  Psychiatric: He has a normal mood and affect. His behavior is normal. Judgment and thought content normal.  Nursing note and vitals reviewed.       Assessment & Plan:   1. Routine physical examination   2. Screening for  diabetes mellitus   3. Screening for HIV (human immunodeficiency virus)   4. Dyslipidemia   5. Elevated blood pressure   6. Screening for prostate cancer   7. Left inguinal hernia   8. Colon cancer screening   9. Bilateral shoulder pain   10. Hematuria     Orders Placed This Encounter  Procedures  . CBC with Differential/Platelet  . Comprehensive metabolic panel    Order Specific Question:  Has the patient fasted?    Answer:  Yes  . Hemoglobin A1c  . Lipid panel    Order Specific Question:  Has the patient fasted?    Answer:  Yes  . PSA  . TSH  . HIV antibody  . Ambulatory referral to Gastroenterology    Referral Priority:  Routine    Referral Type:  Consultation    Referral Reason:  Specialty Services Required    Number of Visits  Requested:  1  . Ambulatory referral to General Surgery    Referral Priority:  Routine    Referral Type:  Surgical    Referral Reason:  Specialty Services Required    Requested Specialty:  General Surgery    Number of Visits Requested:  1  . POCT urinalysis dipstick  . POCT Microscopic Urinalysis ([facility])  . EKG 12-Lead   No orders of the defined types were placed in this encounter.    No Follow-up on file.    Alec Chavez Paulita FujitaMartin Nijah Orlich, M.D. [facility]  [place] [address] [place], [place]  [id number] [phone] phone [phone] fax

## 2015-10-14 ENCOUNTER — Ambulatory Visit (INDEPENDENT_AMBULATORY_CARE_PROVIDER_SITE_OTHER): Payer: BLUE CROSS/BLUE SHIELD | Admitting: Family Medicine

## 2015-10-14 VITALS — BP 134/84 | HR 86 | Temp 98.3°F | Resp 17 | Ht 67.5 in | Wt 166.0 lb

## 2015-10-14 DIAGNOSIS — M19012 Primary osteoarthritis, left shoulder: Secondary | ICD-10-CM | POA: Diagnosis not present

## 2015-10-14 DIAGNOSIS — N029 Recurrent and persistent hematuria with unspecified morphologic changes: Secondary | ICD-10-CM | POA: Diagnosis not present

## 2015-10-14 DIAGNOSIS — M19011 Primary osteoarthritis, right shoulder: Secondary | ICD-10-CM

## 2015-10-14 LAB — HEMOGLOBIN A1C
HEMOGLOBIN A1C: 5.6 % (ref <5.7)
Mean Plasma Glucose: 114 mg/dL (ref <117)

## 2015-10-14 LAB — COMPREHENSIVE METABOLIC PANEL
ALBUMIN: 4.5 g/dL (ref 3.6–5.1)
ALK PHOS: 51 U/L (ref 40–115)
ALT: 43 U/L (ref 9–46)
AST: 32 U/L (ref 10–35)
BUN: 10 mg/dL (ref 7–25)
CALCIUM: 9.4 mg/dL (ref 8.6–10.3)
CO2: 30 mmol/L (ref 20–31)
Chloride: 102 mmol/L (ref 98–110)
Creat: 0.9 mg/dL (ref 0.70–1.33)
Glucose, Bld: 84 mg/dL (ref 65–99)
POTASSIUM: 4.3 mmol/L (ref 3.5–5.3)
Sodium: 139 mmol/L (ref 135–146)
TOTAL PROTEIN: 7.2 g/dL (ref 6.1–8.1)
Total Bilirubin: 0.6 mg/dL (ref 0.2–1.2)

## 2015-10-14 LAB — CBC WITH DIFFERENTIAL/PLATELET
Basophils Absolute: 0 10*3/uL (ref 0.0–0.1)
Basophils Relative: 0 % (ref 0–1)
EOS ABS: 0.1 10*3/uL (ref 0.0–0.7)
EOS PCT: 2 % (ref 0–5)
HEMATOCRIT: 41 % (ref 39.0–52.0)
HEMOGLOBIN: 13.8 g/dL (ref 13.0–17.0)
Lymphocytes Relative: 24 % (ref 12–46)
Lymphs Abs: 1.3 10*3/uL (ref 0.7–4.0)
MCH: 31.4 pg (ref 26.0–34.0)
MCHC: 33.7 g/dL (ref 30.0–36.0)
MCV: 93.2 fL (ref 78.0–100.0)
MPV: 9.4 fL (ref 8.6–12.4)
Monocytes Absolute: 0.6 10*3/uL (ref 0.1–1.0)
Monocytes Relative: 11 % (ref 3–12)
Neutro Abs: 3.4 10*3/uL (ref 1.7–7.7)
Neutrophils Relative %: 63 % (ref 43–77)
Platelets: 319 10*3/uL (ref 150–400)
RBC: 4.4 MIL/uL (ref 4.22–5.81)
RDW: 13.3 % (ref 11.5–15.5)
WBC: 5.4 10*3/uL (ref 4.0–10.5)

## 2015-10-14 LAB — LIPID PANEL
CHOLESTEROL: 185 mg/dL (ref 125–200)
HDL: 76 mg/dL (ref >=40)
LDL CALC: 88 mg/dL (ref <130)
TRIGLYCERIDES: 105 mg/dL (ref <150)
Total CHOL/HDL Ratio: 2.4 Ratio (ref <=5.0)
VLDL: 21 mg/dL (ref <30)

## 2015-10-14 LAB — HIV ANTIBODY (ROUTINE TESTING W REFLEX): HIV: NONREACTIVE

## 2015-10-14 LAB — TSH: TSH: 2.845 u[IU]/mL (ref 0.350–4.500)

## 2015-10-14 MED ORDER — MELOXICAM 15 MG PO TABS: 15.0000 mg | ORAL_TABLET | Freq: Every day | ORAL | Status: DC | PRN

## 2015-10-14 MED ORDER — TRIAMCINOLONE ACETONIDE 40 MG/ML IJ SUSP
40.0000 mg | Freq: Once | INTRAMUSCULAR | Status: AC
  Administered 2015-10-14: 40 mg via INTRAMUSCULAR

## 2015-10-14 MED ORDER — TRIAMCINOLONE ACETONIDE 40 MG/ML IJ SUSP
40.0000 mg | Freq: Once | INTRAMUSCULAR | Status: AC
  Administered 2015-10-14: 40 mg via INTRA_ARTICULAR

## 2015-10-14 NOTE — Patient Instructions (Addendum)
Do not use the meloxicam with any other otc pain medication other than tylenol/acetaminophen - so no aleve, ibuprofen, motrin, advil, etc. Do not use the meloxicam daily   Impingement Syndrome, Rotator Cuff, Bursitis With Rehab Impingement syndrome is a condition that involves inflammation of the tendons of the rotator cuff and the subacromial bursa, that causes pain in the shoulder. The rotator cuff consists of four tendons and muscles that control much of the shoulder and upper arm function. The subacromial bursa is a fluid filled sac that helps reduce friction between the rotator cuff and one of the bones of the shoulder (acromion). Impingement syndrome is usually an overuse injury that causes swelling of the bursa (bursitis), swelling of the tendon (tendonitis), and/or a tear of the tendon (strain). Strains are classified into three categories. Grade 1 strains cause pain, but the tendon is not lengthened. Grade 2 strains include a lengthened ligament, due to the ligament being stretched or partially ruptured. With grade 2 strains there is still function, although the function may be decreased. Grade 3 strains include a complete tear of the tendon or muscle, and function is usually impaired. SYMPTOMS   Pain around the shoulder, often at the outer portion of the upper arm.  Pain that gets worse with shoulder function, especially when reaching overhead or lifting.  Sometimes, aching when not using the arm.  Pain that wakes you up at night.  Sometimes, tenderness, swelling, warmth, or redness over the affected area.  Loss of strength.  Limited motion of the shoulder, especially reaching behind the back (to the back pocket or to unhook bra) or across your body.  Crackling sound (crepitation) when moving the arm.  Biceps tendon pain and inflammation (in the front of the shoulder). Worse when bending the elbow or lifting. CAUSES  Impingement syndrome is often an overuse injury, in which chronic  (repetitive) motions cause the tendons or bursa to become inflamed. A strain occurs when a force is paced on the tendon or muscle that is greater than it can withstand. Common mechanisms of injury include: Stress from sudden increase in duration, frequency, or intensity of training.  Direct hit (trauma) to the shoulder.  Aging, erosion of the tendon with normal use.  Bony bump on shoulder (acromial spur). RISK INCREASES WITH:  Contact sports (football, wrestling, boxing).  Throwing sports (baseball, tennis, volleyball).  Weightlifting and bodybuilding.  Heavy labor.  Previous injury to the rotator cuff, including impingement.  Poor shoulder strength and flexibility.  Failure to warm up properly before activity.  Inadequate protective equipment.  Old age.  Bony bump on shoulder (acromial spur). PREVENTION   Warm up and stretch properly before activity.  Allow for adequate recovery between workouts.  Maintain physical fitness:  Strength, flexibility, and endurance.  Cardiovascular fitness.  Learn and use proper exercise technique. PROGNOSIS  If treated properly, impingement syndrome usually goes away within 6 weeks. Sometimes surgery is required.  RELATED COMPLICATIONS   Longer healing time if not properly treated, or if not given enough time to heal.  Recurring symptoms, that result in a chronic condition.  Shoulder stiffness, frozen shoulder, or loss of motion.  Rotator cuff tendon tear.  Recurring symptoms, especially if activity is resumed too soon, with overuse, with a direct blow, or when using poor technique. TREATMENT  Treatment first involves the use of ice and medicine, to reduce pain and inflammation. The use of strengthening and stretching exercises may help reduce pain with activity. These exercises may be performed at  home or with a therapist. If non-surgical treatment is unsuccessful after more than 6 months, surgery may be advised. After surgery  and rehabilitation, activity is usually possible in 3 months.  MEDICATION  If pain medicine is needed, nonsteroidal anti-inflammatory medicines (aspirin and ibuprofen), or other minor pain relievers (acetaminophen), are often advised.  Do not take pain medicine for 7 days before surgery.  Prescription pain relievers may be given, if your caregiver thinks they are needed. Use only as directed and only as much as you need.  Corticosteroid injections may be given by your caregiver. These injections should be reserved for the most serious cases, because they may only be given a certain number of times. HEAT AND COLD  Cold treatment (icing) should be applied for 10 to 15 minutes every 2 to 3 hours for inflammation and pain, and immediately after activity that aggravates your symptoms. Use ice packs or an ice massage.  Heat treatment may be used before performing stretching and strengthening activities prescribed by your caregiver, physical therapist, or athletic trainer. Use a heat pack or a warm water soak. SEEK MEDICAL CARE IF:   Symptoms get worse or do not improve in 4 to 6 weeks, despite treatment.  New, unexplained symptoms develop. (Drugs used in treatment may produce side effects.) EXERCISES  RANGE OF MOTION (ROM) AND STRETCHING EXERCISES - Impingement Syndrome (Rotator Cuff  Tendinitis, Bursitis) These exercises may help you when beginning to rehabilitate your injury. Your symptoms may go away with or without further involvement from your physician, physical therapist or athletic trainer. While completing these exercises, remember:   Restoring tissue flexibility helps normal motion to return to the joints. This allows healthier, less painful movement and activity.  An effective stretch should be held for at least 30 seconds.  A stretch should never be painful. You should only feel a gentle lengthening or release in the stretched tissue. STRETCH - Flexion, Standing  Stand with good  posture. With an underhand grip on your right / left hand, and an overhand grip on the opposite hand, grasp a broomstick or cane so that your hands are a little more than shoulder width apart.  Keeping your right / left elbow straight and shoulder muscles relaxed, push the stick with your opposite hand, to raise your right / left arm in front of your body and then overhead. Raise your arm until you feel a stretch in your right / left shoulder, but before you have increased shoulder pain.  Try to avoid shrugging your right / left shoulder as your arm rises, by keeping your shoulder blade tucked down and toward your mid-back spine. Hold for __________ seconds.  Slowly return to the starting position. Repeat __________ times. Complete this exercise __________ times per day. STRETCH - Abduction, Supine  Lie on your back. With an underhand grip on your right / left hand and an overhand grip on the opposite hand, grasp a broomstick or cane so that your hands are a little more than shoulder width apart.  Keeping your right / left elbow straight and your shoulder muscles relaxed, push the stick with your opposite hand, to raise your right / left arm out to the side of your body and then overhead. Raise your arm until you feel a stretch in your right / left shoulder, but before you have increased shoulder pain.  Try to avoid shrugging your right / left shoulder as your arm rises, by keeping your shoulder blade tucked down and toward your mid-back  spine. Hold for __________ seconds.  Slowly return to the starting position. Repeat __________ times. Complete this exercise __________ times per day. ROM - Flexion, Active-Assisted  Lie on your back. You may bend your knees for comfort.  Grasp a broomstick or cane so your hands are about shoulder width apart. Your right / left hand should grip the end of the stick, so that your hand is positioned "thumbs-up," as if you were about to shake hands.  Using your  healthy arm to lead, raise your right / left arm overhead, until you feel a gentle stretch in your shoulder. Hold for __________ seconds.  Use the stick to assist in returning your right / left arm to its starting position. Repeat __________ times. Complete this exercise __________ times per day.  ROM - Internal Rotation, Supine   Lie on your back on a firm surface. Place your right / left elbow about 60 degrees away from your side. Elevate your elbow with a folded towel, so that the elbow and shoulder are the same height.  Using a broomstick or cane and your strong arm, pull your right / left hand toward your body until you feel a gentle stretch, but no increase in your shoulder pain. Keep your shoulder and elbow in place throughout the exercise.  Hold for __________ seconds. Slowly return to the starting position. Repeat __________ times. Complete this exercise __________ times per day. STRETCH - Internal Rotation  Place your right / left hand behind your back, palm up.  Throw a towel or belt over your opposite shoulder. Grasp the towel with your right / left hand.  While keeping an upright posture, gently pull up on the towel, until you feel a stretch in the front of your right / left shoulder.  Avoid shrugging your right / left shoulder as your arm rises, by keeping your shoulder blade tucked down and toward your mid-back spine.  Hold for __________ seconds. Release the stretch, by lowering your healthy hand. Repeat __________ times. Complete this exercise __________ times per day. ROM - Internal Rotation   Using an underhand grip, grasp a stick behind your back with both hands.  While standing upright with good posture, slide the stick up your back until you feel a mild stretch in the front of your shoulder.  Hold for __________ seconds. Slowly return to your starting position. Repeat __________ times. Complete this exercise __________ times per day.  STRETCH - Posterior Shoulder  Capsule   Stand or sit with good posture. Grasp your right / left elbow and draw it across your chest, keeping it at the same height as your shoulder.  Pull your elbow, so your upper arm comes in closer to your chest. Pull until you feel a gentle stretch in the back of your shoulder.  Hold for __________ seconds. Repeat __________ times. Complete this exercise __________ times per day. STRENGTHENING EXERCISES - Impingement Syndrome (Rotator Cuff Tendinitis, Bursitis) These exercises may help you when beginning to rehabilitate your injury. They may resolve your symptoms with or without further involvement from your physician, physical therapist or athletic trainer. While completing these exercises, remember:  Muscles can gain both the endurance and the strength needed for everyday activities through controlled exercises.  Complete these exercises as instructed by your physician, physical therapist or athletic trainer. Increase the resistance and repetitions only as guided.  You may experience muscle soreness or fatigue, but the pain or discomfort you are trying to eliminate should never worsen during these exercises.  If this pain does get worse, stop and make sure you are following the directions exactly. If the pain is still present after adjustments, discontinue the exercise until you can discuss the trouble with your clinician.  During your recovery, avoid activity or exercises which involve actions that place your injured hand or elbow above your head or behind your back or head. These positions stress the tissues which you are trying to heal. STRENGTH - Scapular Depression and Adduction   With good posture, sit on a firm chair. Support your arms in front of you, with pillows, arm rests, or on a table top. Have your elbows in line with the sides of your body.  Gently draw your shoulder blades down and toward your mid-back spine. Gradually increase the tension, without tensing the muscles  along the top of your shoulders and the back of your neck.  Hold for __________ seconds. Slowly release the tension and relax your muscles completely before starting the next repetition.  After you have practiced this exercise, remove the arm support and complete the exercise in standing as well as sitting position. Repeat __________ times. Complete this exercise __________ times per day.  STRENGTH - Shoulder Abductors, Isometric  With good posture, stand or sit about 4-6 inches from a wall, with your right / left side facing the wall.  Bend your right / left elbow. Gently press your right / left elbow into the wall. Increase the pressure gradually, until you are pressing as hard as you can, without shrugging your shoulder or increasing any shoulder discomfort.  Hold for __________ seconds.  Release the tension slowly. Relax your shoulder muscles completely before you begin the next repetition. Repeat __________ times. Complete this exercise __________ times per day.  STRENGTH - External Rotators, Isometric  Keep your right / left elbow at your side and bend it 90 degrees.  Step into a door frame so that the outside of your right / left wrist can press against the door frame without your upper arm leaving your side.  Gently press your right / left wrist into the door frame, as if you were trying to swing the back of your hand away from your stomach. Gradually increase the tension, until you are pressing as hard as you can, without shrugging your shoulder or increasing any shoulder discomfort.  Hold for __________ seconds.  Release the tension slowly. Relax your shoulder muscles completely before you begin the next repetition. Repeat __________ times. Complete this exercise __________ times per day.  STRENGTH - Supraspinatus   Stand or sit with good posture. Grasp a __________ weight, or an exercise band or tubing, so that your hand is "thumbs-up," like you are shaking hands.  Slowly  lift your right / left arm in a "V" away from your thigh, diagonally into the space between your side and straight ahead. Lift your hand to shoulder height or as far as you can, without increasing any shoulder pain. At first, many people do not lift their hands above shoulder height.  Avoid shrugging your right / left shoulder as your arm rises, by keeping your shoulder blade tucked down and toward your mid-back spine.  Hold for __________ seconds. Control the descent of your hand, as you slowly return to your starting position. Repeat __________ times. Complete this exercise __________ times per day.  STRENGTH - External Rotators  Secure a rubber exercise band or tubing to a fixed object (table, pole) so that it is at the same height as your  right / left elbow when you are standing or sitting on a firm surface.  Stand or sit so that the secured exercise band is at your uninjured side.  Bend your right / left elbow 90 degrees. Place a folded towel or small pillow under your right / left arm, so that your elbow is a few inches away from your side.  Keeping the tension on the exercise band, pull it away from your body, as if pivoting on your elbow. Be sure to keep your body steady, so that the movement is coming only from your rotating shoulder.  Hold for __________ seconds. Release the tension in a controlled manner, as you return to the starting position. Repeat __________ times. Complete this exercise __________ times per day.  STRENGTH - Internal Rotators   Secure a rubber exercise band or tubing to a fixed object (table, pole) so that it is at the same height as your right / left elbow when you are standing or sitting on a firm surface.  Stand or sit so that the secured exercise band is at your right / left side.  Bend your elbow 90 degrees. Place a folded towel or small pillow under your right / left arm so that your elbow is a few inches away from your side.  Keeping the tension on the  exercise band, pull it across your body, toward your stomach. Be sure to keep your body steady, so that the movement is coming only from your rotating shoulder.  Hold for __________ seconds. Release the tension in a controlled manner, as you return to the starting position. Repeat __________ times. Complete this exercise __________ times per day.  STRENGTH - Scapular Protractors, Standing   Stand arms length away from a wall. Place your hands on the wall, keeping your elbows straight.  Begin by dropping your shoulder blades down and toward your mid-back spine.  To strengthen your protractors, keep your shoulder blades down, but slide them forward on your rib cage. It will feel as if you are lifting the back of your rib cage away from the wall. This is a subtle motion and can be challenging to complete. Ask your caregiver for further instruction, if you are not sure you are doing the exercise correctly.  Hold for __________ seconds. Slowly return to the starting position, resting the muscles completely before starting the next repetition. Repeat __________ times. Complete this exercise __________ times per day. STRENGTH - Scapular Protractors, Supine  Lie on your back on a firm surface. Extend your right / left arm straight into the air while holding a __________ weight in your hand.  Keeping your head and back in place, lift your shoulder off the floor.  Hold for __________ seconds. Slowly return to the starting position, and allow your muscles to relax completely before starting the next repetition. Repeat __________ times. Complete this exercise __________ times per day. STRENGTH - Scapular Protractors, Quadruped  Get onto your hands and knees, with your shoulders directly over your hands (or as close as you can be, comfortably).  Keeping your elbows locked, lift the back of your rib cage up into your shoulder blades, so your mid-back rounds out. Keep your neck muscles relaxed.  Hold  this position for __________ seconds. Slowly return to the starting position and allow your muscles to relax completely before starting the next repetition. Repeat __________ times. Complete this exercise __________ times per day.  STRENGTH - Scapular Retractors  Secure a rubber exercise band or tubing  to a fixed object (table, pole), so that it is at the height of your shoulders when you are either standing, or sitting on a firm armless chair.  With a palm down grip, grasp an end of the band in each hand. Straighten your elbows and lift your hands straight in front of you, at shoulder height. Step back, away from the secured end of the band, until it becomes tense.  Squeezing your shoulder blades together, draw your elbows back toward your sides, as you bend them. Keep your upper arms lifted away from your body throughout the exercise.  Hold for __________ seconds. Slowly ease the tension on the band, as you reverse the directions and return to the starting position. Repeat __________ times. Complete this exercise __________ times per day. STRENGTH - Shoulder Extensors   Secure a rubber exercise band or tubing to a fixed object (table, pole) so that it is at the height of your shoulders when you are either standing, or sitting on a firm armless chair.  With a thumbs-up grip, grasp an end of the band in each hand. Straighten your elbows and lift your hands straight in front of you, at shoulder height. Step back, away from the secured end of the band, until it becomes tense.  Squeezing your shoulder blades together, pull your hands down to the sides of your thighs. Do not allow your hands to go behind you.  Hold for __________ seconds. Slowly ease the tension on the band, as you reverse the directions and return to the starting position. Repeat __________ times. Complete this exercise __________ times per day.  STRENGTH - Scapular Retractors and External Rotators   Secure a rubber exercise  band or tubing to a fixed object (table, pole) so that it is at the height as your shoulders, when you are either standing, or sitting on a firm armless chair.  With a palm down grip, grasp an end of the band in each hand. Bend your elbows 90 degrees and lift your elbows to shoulder height, at your sides. Step back, away from the secured end of the band, until it becomes tense.  Squeezing your shoulder blades together, rotate your shoulders so that your upper arms and elbows remain stationary, but your fists travel upward to head height.  Hold for __________ seconds. Slowly ease the tension on the band, as you reverse the directions and return to the starting position. Repeat __________ times. Complete this exercise __________ times per day.  STRENGTH - Scapular Retractors and External Rotators, Rowing   Secure a rubber exercise band or tubing to a fixed object (table, pole) so that it is at the height of your shoulders, when you are either standing, or sitting on a firm armless chair.  With a palm down grip, grasp an end of the band in each hand. Straighten your elbows and lift your hands straight in front of you, at shoulder height. Step back, away from the secured end of the band, until it becomes tense.  Step 1: Squeeze your shoulder blades together. Bending your elbows, draw your hands to your chest, as if you are rowing a boat. At the end of this motion, your hands and elbow should be at shoulder height and your elbows should be out to your sides.  Step 2: Rotate your shoulders, to raise your hands above your head. Your forearms should be vertical and your upper arms should be horizontal.  Hold for __________ seconds. Slowly ease the tension on the band,  as you reverse the directions and return to the starting position. Repeat __________ times. Complete this exercise __________ times per day.  STRENGTH - Scapular Depressors  Find a sturdy chair without wheels, such as a dining room  chair.  Keeping your feet on the floor, and your hands on the chair arms, lift your bottom up from the seat, and lock your elbows.  Keeping your elbows straight, allow gravity to pull your body weight down. Your shoulders will rise toward your ears.  Raise your body against gravity by drawing your shoulder blades down your back, shortening the distance between your shoulders and ears. Although your feet should always maintain contact with the floor, your feet should progressively support less body weight, as you get stronger.  Hold for __________ seconds. In a controlled and slow manner, lower your body weight to begin the next repetition. Repeat __________ times. Complete this exercise __________ times per day.    This information is not intended to replace advice given to you by your health care provider. Make sure you discuss any questions you have with your health care provider.   Document Released: 10/13/2005 Document Revised: 11/03/2014 Document Reviewed: 01/25/2009 [employer] Interactive Patient Education 2016 [employer]  Osteoarthritis Osteoarthritis is a disease that causes soreness and inflammation of a joint. It occurs when the cartilage at the affected joint wears down. Cartilage acts as a cushion, covering the ends of bones where they meet to form a joint. Osteoarthritis is the most common form of arthritis. It often occurs in older people. The joints affected most often by this condition include those in the:  Ends of the fingers.  Thumbs.  Neck.  Lower back.  Knees.  Hips. CAUSES  Over time, the cartilage that covers the ends of bones begins to wear away. This causes bone to rub on bone, producing pain and stiffness in the affected joints.  RISK FACTORS Certain factors can increase your chances of having osteoarthritis, including:  Older age.  Excessive body weight.  Overuse of joints.  Previous joint injury. SIGNS AND SYMPTOMS   Pain, swelling, and  stiffness in the joint.  Over time, the joint may lose its normal shape.  Small deposits of bone (osteophytes) may grow on the edges of the joint.  Bits of bone or cartilage can break off and float inside the joint space. This may cause more pain and damage. DIAGNOSIS  Your health care provider will do a physical exam and ask about your symptoms. Various tests may be ordered, such as:  X-rays of the affected joint.  Blood tests to rule out other types of arthritis. Additional tests may be used to diagnose your condition. TREATMENT  Goals of treatment are to control pain and improve joint function. Treatment plans may include:  A prescribed exercise program that allows for rest and joint relief.  A weight control plan.  Pain relief techniques, such as:  Properly applied heat and cold.  Electric pulses delivered to nerve endings under the skin (transcutaneous electrical nerve stimulation [TENS]).  Massage.  Certain nutritional supplements.  Medicines to control pain, such as:  Acetaminophen.  Nonsteroidal anti-inflammatory drugs (NSAIDs), such as naproxen.  Narcotic or central-acting agents, such as tramadol.  Corticosteroids. These can be given orally or as an injection.  Surgery to reposition the bones and relieve pain (osteotomy) or to remove loose pieces of bone and cartilage. Joint replacement may be needed in advanced states of osteoarthritis. HOME CARE INSTRUCTIONS   Take medicines only as  directed by your health care provider.  Maintain a healthy weight. Follow your health care provider's instructions for weight control. This may include dietary instructions.  Exercise as directed. Your health care provider can recommend specific types of exercise. These may include:  Strengthening exercises. These are done to strengthen the muscles that support joints affected by arthritis. They can be performed with weights or with exercise bands to add resistance.  Aerobic  activities. These are exercises, such as brisk walking or low-impact aerobics, that get your heart pumping.  Range-of-motion activities. These keep your joints limber.  Balance and agility exercises. These help you maintain daily living skills.  Rest your affected joints as directed by your health care provider.  Keep all follow-up visits as directed by your health care provider. SEEK MEDICAL CARE IF:   Your skin turns red.  You develop a rash in addition to your joint pain.  You have worsening joint pain.  You have a fever along with joint or muscle aches. SEEK IMMEDIATE MEDICAL CARE IF:  You have a significant loss of weight or appetite.  You have night sweats. FOR MORE INFORMATION   National Institute of Arthritis and Musculoskeletal and Skin Diseases: www.niams.http://www.myers.net/  [employer] on Aging: https://walker.com/  American College of Rheumatology: www.rheumatology.org   This information is not intended to replace advice given to you by your health care provider. Make sure you discuss any questions you have with your health care provider.   Document Released: 10/13/2005 Document Revised: 11/03/2014 Document Reviewed: 06/20/2013 Elsevier Interactive Patient Education [employer]

## 2015-10-14 NOTE — Progress Notes (Signed)
Subjective:    Patient ID: Alec Chavez, male    DOB: [birth year], 54 y.o.   MRN: [id number] Chief Complaint  Patient presents with  . Shoulder Pain    patient wants a shot for shoulder pain     HPI Has arthritis in both shoulder and a torn ligament in the left but he is wanting to defer specialist referral for as long as possible. He had bilateral shoulder injections in 2014 that worked well for him and he would like to repeat.  Did have arthroscopy on right >15 years ago for bone spurs  He was seen here yesterday for a full CPE. He reports he pain for shoulder injection then but they were unable to be done.  Take 2 arthritis pills a day but has cut back on them as wanting to minimize meds and side effects  Pt reports he has moderate amount of microscopic hematuria at baseline.  Past Medical History  Diagnosis Date  . Hyperlipidemia   . Arthritis     Shoulders B   Current Outpatient Prescriptions on File Prior to Visit  Medication Sig Dispense Refill  . Ascorbic Acid (VITAMIN C) 100 MG tablet Take 100 mg by mouth daily.    Marland Kitchen aspirin 81 MG tablet Take 81 mg by mouth daily.      . Multiple Vitamin (MULTIVITAMIN) tablet Take 1 tablet by mouth daily. Reported on 10/14/2015     No current facility-administered medications on file prior to visit.   Allergies  Allergen Reactions  . Codeine Nausea Only    Dizziness    Review of Systems  Constitutional: Negative for fever, chills, diaphoresis, activity change and appetite change.  Genitourinary: Positive for hematuria. Negative for dysuria, urgency, frequency and decreased urine volume.  Musculoskeletal: Positive for arthralgias. Negative for myalgias, joint swelling and gait problem.  Skin: Negative for color change, rash and wound.  Neurological: Negative for weakness and numbness.  Hematological: Negative for adenopathy. Does not bruise/bleed easily.       Objective:  BP 134/84 mmHg  Pulse 86  Temp(Src) 98.3 F (36.8 C)  (Oral)  Resp 17  Ht 5' 7.5" (1.715 m)  Wt 166 lb (75.297 kg)  BMI 25.60 kg/m2  SpO2 99%  Physical Exam  Constitutional: He is oriented to person, place, and time. He appears well-developed and well-nourished. No distress.  HENT:  Head: Normocephalic and atraumatic.  Eyes: No scleral icterus.  Pulmonary/Chest: Effort normal.  Musculoskeletal:       Right shoulder: He exhibits bony tenderness and pain. He exhibits normal range of motion, no swelling, no effusion and normal strength.  Pt with prominent AC joints, some moderate ttp with palpation of AC joints, no pain over acromion or bursa.  Neurological: He is alert and oriented to person, place, and time.  Skin: Skin is warm and dry. He is not diaphoretic.  Psychiatric: He has a normal mood and affect. His behavior is normal.   Risks/benefits of injection reviewed and informed consent obtained.  cleaned with etoh x 2 then betadine x 2.  Anesthesia w/ ethyl chloride cold spray.Injected with  of Kenalog and 5cc of 1% lidocaine using 21g 1 1/2in needle in posterior approach without complications into each shoulder. Pt tolerated procedure well. No EBL.     Assessment & Plan:   1. Osteoarthritis of both shoulders, unspecified osteoarthritis type   s/p bilateral cortisone inj today, tol well.  2. Microscopic hematuria - benign, long-standing per pt  Meds ordered this encounter  Medications  . triamcinolone acetonide (KENALOG-40) injection 40 mg    Sig:   . triamcinolone acetonide (KENALOG-40) injection 40 mg    Sig:   . meloxicam (MOBIC) 15 MG tablet    Sig: Take 1 tablet (15 mg total) by mouth daily as needed for pain.    Dispense:  30 tablet    Refill:  2    Do not use with any other otc pain medication other than tylenol/acetaminophen - so no aleve, ibuprofen, motrin, advil, etc.    I personally performed the services described in this documentation, which was scribed in my presence. The recorded information has been  reviewed and considered, and addended by me as needed.  Norberto SorensonEva Melynda Krzywicki, MD MPH

## 2015-10-15 LAB — PSA: PSA: 0.67 ng/mL (ref <=4.00)

## 2015-10-24 DIAGNOSIS — N029 Recurrent and persistent hematuria with unspecified morphologic changes: Secondary | ICD-10-CM | POA: Insufficient documentation

## 2015-11-08 ENCOUNTER — Telehealth: Payer: Self-pay | Admitting: *Deleted

## 2015-11-08 NOTE — Telephone Encounter (Signed)
-----   Message from Ethelda ChickKristi M Smith, MD sent at 11/05/2015 11:06 PM EST ----- Call -- 1.  Urine with moderate amount of blood present. Per Dr. Alver FisherShaw's note, patient has a long-standing history of blood in the urine. Has he ever undergone evaluation by a urologist?  2. Prostate/PSA is normal.  3. No evidence of diabetes.   4. No evidence of HIV.   5 . Thyroid function is normal.  6. Liver and kidney functions are normal.  7. Cholesterol is normal.  8. No evidence of anemia.

## 2015-11-08 NOTE — Telephone Encounter (Signed)
Pt notified of results.  He has never seen a urologist.  He will go and see one if you want him to, but he would like to wait after his colonoscopy.  He will call us back after his colonoscopy.

## 2015-11-11 NOTE — Telephone Encounter (Signed)
Noted. Agree.

## 2015-12-21 ENCOUNTER — Ambulatory Visit (INDEPENDENT_AMBULATORY_CARE_PROVIDER_SITE_OTHER): Payer: BLUE CROSS/BLUE SHIELD | Admitting: Family Medicine

## 2015-12-21 VITALS — BP 128/80 | HR 112 | Temp 98.9°F | Resp 16 | Ht 68.0 in | Wt 169.0 lb

## 2015-12-21 DIAGNOSIS — R6889 Other general symptoms and signs: Secondary | ICD-10-CM | POA: Diagnosis not present

## 2015-12-21 DIAGNOSIS — J111 Influenza due to unidentified influenza virus with other respiratory manifestations: Secondary | ICD-10-CM

## 2015-12-21 LAB — POCT INFLUENZA A/B
Influenza A, POC: NEGATIVE
Influenza B, POC: NEGATIVE

## 2015-12-21 MED ORDER — BENZONATATE 100 MG PO CAPS: 100.0000 mg | ORAL_CAPSULE | Freq: Three times a day (TID) | ORAL | Status: DC | PRN

## 2015-12-21 MED ORDER — OSELTAMIVIR PHOSPHATE 75 MG PO CAPS: 75.0000 mg | ORAL_CAPSULE | Freq: Two times a day (BID) | ORAL | Status: DC

## 2015-12-21 MED ORDER — MUCINEX DM 30-600 MG PO TB12: 1.0000 | ORAL_TABLET | Freq: Two times a day (BID) | ORAL | Status: DC

## 2015-12-21 NOTE — Patient Instructions (Signed)

## 2015-12-21 NOTE — Progress Notes (Signed)
Subjective:    Patient ID: Alec Chavez, male    DOB: 1961/07/05, 55 y.o.   MRN: [id number]  12/21/2015  Cough   HPI This 54 y.o. male presents for evaluation of cough. Onset four days ago.  No fever; +chills/sweats.  +chest hurts; back hurts; +body aches.  +HA.  +ST with onset; no ear pain.  +rhinorrhea; +nasal congestion.  +coughing moderately; no sputum. Nasal congestion clear.  No vomiting; no diarrhea.  Theraflu and Alkeseltzer..  No Ibuprofen; +aleve.  Wife with the flu.     Review of Systems  Constitutional: Positive for chills, diaphoresis and fatigue. Negative for fever.  HENT: Positive for congestion, rhinorrhea, sore throat and voice change. Negative for ear pain and trouble swallowing.   Respiratory: Positive for cough. Negative for shortness of breath and wheezing.   Gastrointestinal: Negative for nausea, vomiting, abdominal pain and diarrhea.  Neurological: Positive for headaches. Negative for dizziness.    Past Medical History  Diagnosis Date  . Hyperlipidemia   . Arthritis     Shoulders B   Past Surgical History  Procedure Laterality Date  . Arthoscopic rotaor cuff repair      right shoulder   Allergies  Allergen Reactions  . Codeine Nausea Only    Dizziness    Social History   Social History  . Marital Status: Married    Spouse Name: N/A  . Number of Children: N/A  . Years of Education: N/A   Occupational History  . Restaurant manager, fast food    Social History Main Topics  . Smoking status: Never Smoker   . Smokeless tobacco: Never Used  . Alcohol Use: 0.0 oz/week    0 Standard drinks or equivalent per week  . Drug Use: No  . Sexual Activity: Yes   Other Topics Concern  . Not on file   Social History Narrative   Marital status: married since 01/2015; second marriage      Children: none      Lives: with wife      Employment: Restaurant manager, fast food for [employer]      Tobacco; none      Alcohol: 7 beers per week; one DUI in 1990s      Drugs: none   Exercise:  Physical job.      Seatbelt: 100% of time     Caffienated drinks-no      Smoke alarm in the home-yes     firearms/guns in the home-no   History of physical abuse-no               Family History  Problem Relation Age of Onset  . Cancer Neg Hx   . Hyperlipidemia Neg Hx   . Hypertension Neg Hx   . Diabetes Neg Hx   . Heart disease Mother 46    AMI  . Arthritis Father     DDD lumbar spine       Objective:    BP 128/80 mmHg  Pulse 112  Temp(Src) 98.9 F (37.2 C) (Oral)  Resp 16  Ht  (1.727 m)  Wt 169 lb (76.658 kg)  BMI 25.70 kg/m2  SpO2 98% Physical Exam  Constitutional: He is oriented to person, place, and time. He appears well-developed and well-nourished. No distress.  HENT:  Head: Normocephalic and atraumatic.  Right Ear: Tympanic membrane, external ear and ear canal normal.  Left Ear: Tympanic membrane, external ear and ear canal normal.  Nose: Nose normal.  Mouth/Throat: Uvula is midline, oropharynx is clear and  moist and mucous membranes are normal.  Eyes: Conjunctivae and EOM are normal. Pupils are equal, round, and reactive to light.  Neck: Normal range of motion. Neck supple. Carotid bruit is not present. No thyromegaly present.  Cardiovascular: Normal rate, regular rhythm, normal heart sounds and intact distal pulses.  Exam reveals no gallop and no friction rub.   No murmur heard. Pulmonary/Chest: Effort normal and breath sounds normal. He has no wheezes. He has no rales.  Lymphadenopathy:    He has no cervical adenopathy.  Neurological: He is alert and oriented to person, place, and time. No cranial nerve deficit.  Skin: Skin is warm and dry. No rash noted. He is not diaphoretic.  Psychiatric: He has a normal mood and affect. His behavior is normal.  Nursing note and vitals reviewed.  Results for orders placed or performed in visit on 12/21/15  POCT Influenza A/B  Result Value Ref Range   Influenza A, POC Negative Negative   Influenza  B, POC Negative Negative       Assessment & Plan:   1. Flu-like symptoms   2. Influenza    -New.  -Tamiflu, Mucinex DM, Tessalon Perles. -Tylenol and Ibuprofen for fever/ha/body aches. -RTC acute worsening.   Orders Placed This Encounter  Procedures  . POCT Influenza A/B   Meds ordered this encounter  Medications  . DISCONTD: oseltamivir (TAMIFLU) 75 MG capsule    Sig: Take 1 capsule (75 mg total) by mouth 2 (two) times daily.    Dispense:  10 capsule    Refill:  0  . oseltamivir (TAMIFLU) 75 MG capsule    Sig: Take 1 capsule (75 mg total) by mouth 2 (two) times daily.    Dispense:  10 capsule    Refill:  0  . Dextromethorphan-Guaifenesin (MUCINEX DM) 30-600 MG TB12    Sig: Take 1 tablet by mouth 2 (two) times daily.    Dispense:  28 each    Refill:  0  . benzonatate (TESSALON) 100 MG capsule    Sig: Take 1-2 capsules (100-200 mg total) by mouth 3 (three) times daily as needed for cough.    Dispense:  40 capsule    Refill:  0    No Follow-up on file.    Fauna Neuner Paulita Fujita, M.D. [facility] [address] [place], [place]  [address] [phone] phone [phone] fax

## 2016-05-01 ENCOUNTER — Ambulatory Visit (INDEPENDENT_AMBULATORY_CARE_PROVIDER_SITE_OTHER): Payer: BLUE CROSS/BLUE SHIELD | Admitting: Physician Assistant

## 2016-05-01 VITALS — BP 172/90 | HR 67 | Temp 98.6°F | Resp 18 | Ht 68.0 in | Wt 170.0 lb

## 2016-05-01 DIAGNOSIS — M25511 Pain in right shoulder: Secondary | ICD-10-CM

## 2016-05-01 DIAGNOSIS — M25512 Pain in left shoulder: Secondary | ICD-10-CM

## 2016-05-01 MED ORDER — TRIAMCINOLONE ACETONIDE 40 MG/ML IJ SUSP
40.0000 mg | Freq: Once | INTRAMUSCULAR | Status: AC
  Administered 2016-05-01: 40 mg via INTRA_ARTICULAR

## 2016-05-01 MED ORDER — CELECOXIB 200 MG PO CAPS: 200.0000 mg | ORAL_CAPSULE | Freq: Every day | ORAL | Status: DC

## 2016-05-01 NOTE — Progress Notes (Signed)
Alec Chavez  MRN: [id number] DOB: 11/17/1960  Subjective:  Pt presents to clinic wanting bilateral shoulder injections.  He has had a bone spur on the right shoulder that has been removed and states that he has remaining arthritis in that shoulder.  His left shoulder is currently bothering him more and states that he has a torn ligament in that shoulder.  He has had PT in the distant past.  He really does not want surgery currenlty mainly due to financial reasons.  His last injection was in December and it helped for about 3 months.  He lifts heavy items at work and he finds that he fatigues quickly due to his shoulder pain.  He has no hand or finger paresthesias but does get tight across his shoulders because of using his arms different because of the pain. Currently he cannot lift his left shoulder in abduction due to the pain.  He tried mobic but it did not help his pain.  Patient Active Problem List   Diagnosis Date Noted  . Benign hematuria 10/24/2015  . Mixed hyperlipidemia 08/21/2011  . Fatigue 06/19/2011  . Routine general medical examination at a health care facility 06/19/2011    Current Outpatient Prescriptions on File Prior to Visit  Medication Sig Dispense Refill  . Ascorbic Acid (VITAMIN C) 100 MG tablet Take 100 mg by mouth daily.    Marland Kitchen. aspirin 81 MG tablet Take 81 mg by mouth daily.      . Multiple Vitamin (MULTIVITAMIN) tablet Take 1 tablet by mouth daily. Reported on 10/14/2015     No current facility-administered medications on file prior to visit.    Allergies  Allergen Reactions  . Codeine Nausea Only    Dizziness    Review of Systems  Musculoskeletal: Negative for neck pain.   Objective:  BP 172/90 mmHg  Pulse 67  Temp(Src) 98.6 F (37 C) (Oral)  Resp 18  Ht 5\' 8"  (1.727 m)  Wt 170 lb (77.111 kg)  BMI 25.85 kg/m2  SpO2 100%  Physical Exam  Constitutional: He is oriented to person, place, and time and well-developed, well-nourished, and in no  distress.  HENT:  Head: Normocephalic and atraumatic.  Right Ear: External ear normal.  Left Ear: External ear normal.  Eyes: Conjunctivae are normal.  Neck: Normal range of motion.  Pulmonary/Chest: Effort normal.  Musculoskeletal:       Right shoulder: He exhibits normal range of motion (overall decrease in ROM - TTP over anterior shoulder, empty can positive, neg impingment testing, decrease internal rotation > external rotation).       Left shoulder: He exhibits decreased range of motion (overall decreased - abduction is what he had the most trouble with - he was unable to do fulll abduction and did the ROM more to the front of his body, decrease internal rotation> external rotation and decreased compared with the right side,) and decreased strength.  On the left - empty can positive, impingement pain with internal roation    Neurological: He is alert and oriented to person, place, and time. Gait normal.  Skin: Skin is warm and dry.  Psychiatric: Mood, memory, affect and judgment normal.    Procedure:  Consent obtained.  Risks and benefits of injections discussed with patient.  Both posterior shoulders were cleaned with ETOH and then betadine prep was performed.  Posterior approach injections were performed bilaterally.  Patient tolerated well.  Assessment and Plan :  Bilateral shoulder pain - Plan: celecoxib (CELEBREX) 200 MG  capsule, Ambulatory referral to Orthopedic Surgery, triamcinolone acetonide (KENALOG-40) injection 40 mg, triamcinolone acetonide (KENALOG-40) injection 40 mg   Bilateral shoulder injections today - d/w pt that due to lack of imaging since 2013 which showed chronic changes he should see an ortho specialist to help with next step.  He has not gotten to many injections but I am concerned with continuing steroid injections with a verbal history of possible ligament tear on the left side.  We will try celebrex for pain relief since mobic has not helped in the  past.  Benny LennertSarah Cailean Heacock PA-C  [facility] 05/01/2016 5:08 PM

## 2016-05-01 NOTE — Patient Instructions (Addendum)
Monitor your BP at home - if it stays above 140/90 you need to be evaluated for it.  If you are not able to check it please recheck in the office in about 2 weeks.    IF you received an x-ray today, you will receive an invoice from [facility]. Please contact [facility] at [phone] with questions or concerns regarding your invoice.   IF you received labwork today, you will receive an invoice from [facility]. Please contact [facility] at [phone] with questions or concerns regarding your invoice.   Our billing staff will not be able to assist you with questions regarding bills from these companies.  You will be contacted with the lab results as soon as they are available. The fastest way to get your results is to activate your My Chart account. Instructions are located on the last page of this paperwork. If you have not heard from us regarding the results in 2 weeks, please contact this office.

## 2016-06-03 ENCOUNTER — Ambulatory Visit (INDEPENDENT_AMBULATORY_CARE_PROVIDER_SITE_OTHER): Payer: BLUE CROSS/BLUE SHIELD

## 2016-06-03 ENCOUNTER — Ambulatory Visit (INDEPENDENT_AMBULATORY_CARE_PROVIDER_SITE_OTHER): Payer: BLUE CROSS/BLUE SHIELD | Admitting: Family Medicine

## 2016-06-03 VITALS — BP 140/98 | HR 86 | Temp 98.7°F | Resp 17 | Ht 68.0 in | Wt 166.0 lb

## 2016-06-03 DIAGNOSIS — M25512 Pain in left shoulder: Secondary | ICD-10-CM

## 2016-06-03 MED ORDER — PREDNISONE 20 MG PO TABS: ORAL_TABLET | ORAL | 0 refills | Status: DC

## 2016-06-03 MED ORDER — GABAPENTIN 100 MG PO CAPS: 300.0000 mg | ORAL_CAPSULE | Freq: Three times a day (TID) | ORAL | 3 refills | Status: DC | PRN

## 2016-06-03 NOTE — Patient Instructions (Addendum)
Take Prednisone as directed starting tomorrow morning to reduce inflammation.  Start Gabapentin 300 mg, up to 3 times daily for pain. May cause drowsiness, take initial dose while at home and avoid operating a vehicle.  Follow-up as needed.    IF you received an x-ray today, you will receive an invoice from [facility]. Please contact [facility] at [phone] with questions or concerns regarding your invoice.   IF you received labwork today, you will receive an invoice from [facility]. Please contact [facility] at [phone] with questions or concerns regarding your invoice.   Our billing staff will not be able to assist you with questions regarding bills from these companies.  You will be contacted with the lab results as soon as they are available. The fastest way to get your results is to activate your My Chart account. Instructions are located on the last page of this paperwork. If you have not heard from [place] regarding the results in 2 weeks, please contact this office.      Shoulder Pain The shoulder is the joint that connects your arms to your body. The bones that form the shoulder joint include the upper arm bone (humerus), the shoulder blade (scapula), and the collarbone (clavicle). The top of the humerus is shaped like a ball and fits into a rather flat socket on the scapula (glenoid cavity). A combination of muscles and strong, fibrous tissues that connect muscles to bones (tendons) support your shoulder joint and hold the ball in the socket. Small, fluid-filled sacs (bursae) are located in different areas of the joint. They act as cushions between the bones and the overlying soft tissues and help reduce friction between the gliding tendons and the bone as you move your arm. Your shoulder joint allows a wide range of motion in your arm. This range of motion allows you to do things like scratch your back or throw a ball. However, this range  of motion also makes your shoulder more prone to pain from overuse and injury. Causes of shoulder pain can originate from both injury and overuse and usually can be grouped in the following four categories:  Redness, swelling, and pain (inflammation) of the tendon (tendinitis) or the bursae (bursitis).  Instability, such as a dislocation of the joint.  Inflammation of the joint (arthritis).  Broken bone (fracture). HOME CARE INSTRUCTIONS   Apply ice to the sore area.  Put ice in a plastic bag.  Place a towel between your skin and the bag.  Leave the ice on for 15-20 minutes, 3-4 times per day for the first 2 days, or as directed by your health care provider.  Stop using cold packs if they do not help with the pain.  If you have a shoulder sling or immobilizer, wear it as long as your caregiver instructs. Only remove it to shower or bathe. Move your arm as little as possible, but keep your hand moving to prevent swelling.  Squeeze a soft ball or foam pad as much as possible to help prevent swelling.  Only take over-the-counter or prescription medicines for pain, discomfort, or fever as directed by your caregiver. SEEK MEDICAL CARE IF:   Your shoulder pain increases, or new pain develops in your arm, hand, or fingers.  Your hand or fingers become cold and numb.  Your pain is not relieved with medicines. SEEK IMMEDIATE MEDICAL CARE IF:   Your arm, hand, or fingers are numb or tingling.  Your arm, hand, or fingers are  significantly swollen or turn white or blue. MAKE SURE YOU:   Understand these instructions.  Will watch your condition.  Will get help right away if you are not doing well or get worse.   This information is not intended to replace advice given to you by your health care provider. Make sure you discuss any questions you have with your health care provider.   Document Released: 07/23/2005 Document Revised: 11/03/2014 Document Reviewed: 02/05/2015 Elsevier  Interactive Patient Education [employer]2016 Elsevier Inc.

## 2016-06-03 NOTE — Progress Notes (Signed)
Patient ID: Alec Chavez, male    DOB: 02/07/1961, 55 y.o.   MRN: [id number]  PCP: Sanda Lingerhomas Jones, MD  Chief Complaint  Patient presents with  . Shoulder Injury    Pulling something at work. Left. Pt states previous torn ligament.     Subjective:   HPI Presents for evaluation of left shoulder pain times 1 week. He reports a history of a pulled ligament in his left shoulder. He noticed the pain about a day after working in a stationary position in which his arms were hyperextended for a long period of time.  Aching pain progressed to left arm weakness with extension and adduction times 4 days ago.  The patient has missed two days of work due to injury.   . Social History   Social History  . Marital status: Married    Spouse name: N/A  . Number of children: N/A  . Years of education: N/A   Occupational History  . Restaurant manager, fast fooddrill operator    Social History Main Topics  . Smoking status: Never Smoker  . Smokeless tobacco: Never Used  . Alcohol use 0.0 oz/week  . Drug use: No  . Sexual activity: Yes   Other Topics Concern  . Not on file   Social History Narrative   Marital status: married since 01/2015; second marriage      Children: none      Lives: with wife      Employment: Restaurant manager, fast fooddrill operator for [employer]      Tobacco; none      Alcohol: 7 beers per week; one DUI in 1990s      Drugs: none      Exercise:  Physical job.      Seatbelt: 100% of time     Caffienated drinks-no      Smoke alarm in the home-yes     firearms/guns in the home-no   History of physical abuse-no               . Family History  Problem Relation Age of Onset  . Cancer Neg Hx   . Hyperlipidemia Neg Hx   . Hypertension Neg Hx   . Diabetes Neg Hx   . Heart disease Mother 530    AMI  . Arthritis Father     DDD lumbar spine      Review of Systems  Respiratory: Negative.   Cardiovascular: Negative.   Musculoskeletal: Positive for arthralgias.       See HPI    Patient Active Problem List   Diagnosis Date Noted  . Bilateral shoulder pain 05/01/2016  . Benign hematuria 10/24/2015  . Mixed hyperlipidemia 08/21/2011  . Fatigue 06/19/2011     Prior to Admission medications   Medication Sig Start Date End Date Taking? Authorizing Provider  Ascorbic Acid (VITAMIN C) 100 MG tablet Take 100 mg by mouth daily.   Yes Historical Provider, MD  celecoxib (CELEBREX) 200 MG capsule Take 1 capsule (200 mg total) by mouth daily. 05/01/16  Yes Morrell RiddleSarah L Weber, PA-C  Multiple Vitamin (MULTIVITAMIN) tablet Take 1 tablet by mouth daily. Reported on 10/14/2015   Yes Historical Provider, MD     Allergies  Allergen Reactions  . Codeine Nausea Only    Dizziness       Objective:  Physical Exam  Constitutional: He is oriented to person, place, and time. He appears well-developed and well-nourished.  HENT:  Head: Normocephalic and atraumatic.  Right Ear: External ear normal.  Left Ear: External ear  normal.  Eyes: Conjunctivae and EOM are normal. Pupils are equal, round, and reactive to light.  Neck: Normal range of motion.  Cardiovascular: Normal rate, regular rhythm, normal heart sounds and intact distal pulses.   Pulmonary/Chest: Effort normal and breath sounds normal.  Musculoskeletal:  Crepitus noted with passive ROM of shoulder-adduction and flexion Weakness with extension left arm compared to the write Bicep and tricep reflex intact +2 bilaterally.   Neurological: He is alert and oriented to person, place, and time.  Skin: Skin is warm and dry.   . Vitals:   06/03/16 1502  BP: (!) 142/90  Pulse: 86  Resp: 17  Temp: 98.7 F (37.1 C)      Assessment & Plan:  .1. Pain in joint of left shoulder - DG Shoulder Left;  Consider a referral to orthopedics if pain persists. 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  Changes are chronically stable presently. Work note provided with a return to work date of 06/05/16.  2. Elevated Blood Pressure   Blood pressure elevated on today's visit.  Please check blood pressure minimally weekly. If it remains greater than 140 systolic or greater than 90 diastolic, please follow-up with PCP.   Godfrey Pick. Tiburcio Pea, MSN, FNP-C [facility]

## 2016-07-19 ENCOUNTER — Ambulatory Visit (INDEPENDENT_AMBULATORY_CARE_PROVIDER_SITE_OTHER): Payer: BLUE CROSS/BLUE SHIELD | Admitting: Family Medicine

## 2016-07-19 VITALS — BP 154/94 | HR 80 | Temp 98.8°F | Resp 16 | Ht 68.0 in | Wt 167.0 lb

## 2016-07-19 DIAGNOSIS — S46912D Strain of unspecified muscle, fascia and tendon at shoulder and upper arm level, left arm, subsequent encounter: Secondary | ICD-10-CM

## 2016-07-19 MED ORDER — DICLOFENAC SODIUM 75 MG PO TBEC: 75.0000 mg | DELAYED_RELEASE_TABLET | Freq: Two times a day (BID) | ORAL | 0 refills | Status: DC

## 2016-07-19 NOTE — Progress Notes (Signed)
55 year old gentleman who presents with left shoulder pain which began approximately 6 weeks ago. He gives a history of straining his left shoulder at home. He has not missed any work until today.  He has tried prednisone without any benefit. He also tried some Congohinese medicine which didn't help either. Is finding that his left arm is getting more and more stiff and sore. The Celebrex and gabapentin have not made any difference.  Patient writes with his right hand but he used to throw his left hand.   Objective:BP (!) 154/94   Pulse 80   Temp 98.8 F (37.1 C) (Oral)   Resp 16   Ht 5\' 8"  (1.727 m)   Wt 167 lb (75.8 kg)   SpO2 98%   BMI 25.39 kg/m  Left shoulder: Inspection is normal Palpation: Nontender Range of motion: Patient has limited internal rotation and abduction.  Assessment: Frozen shoulder, left and getting worse.  Plan: Physical therapy consult and Voltaren 75 twice a day  Signed Sheila OatsKurt Samani Deal M.D.

## 2016-07-19 NOTE — Patient Instructions (Addendum)
   IF you received an x-ray today, you will receive an invoice from [facility]. Please contact [facility] at [phone] with questions or concerns regarding your invoice.   IF you received labwork today, you will receive an invoice from [facility]. Please contact [facility] at [phone] with questions or concerns regarding your invoice.   Our billing staff will not be able to assist you with questions regarding bills from these companies.  You will be contacted with the lab results as soon as they are available. The fastest way to get your results is to activate your My Chart account. Instructions are located on the last page of this paperwork. If you have not heard from us regarding the results in 2 weeks, please contact this office.     Adhesive Capsulitis Adhesive capsulitis is inflammation of the tendons and ligaments that surround the shoulder joint (shoulder capsule). This condition causes the shoulder to become stiff and painful to move. Adhesive capsulitis is also called frozen shoulder. CAUSES This condition may be caused by:  An injury to the shoulder joint.  Straining the shoulder.  Not moving the shoulder for a period of time. This can happen if your arm was injured or in a sling.  Long-standing health problems, such as:  Diabetes.  Thyroid problems.  Heart disease.  Stroke.  Rheumatoid arthritis.  Lung disease. In some cases, the cause may not be known. RISK FACTORS This condition is more likely to develop in:  Women.  People who are older than 54 years of age. SYMPTOMS Symptoms of this condition include:  Pain in the shoulder when moving the arm. There may also be pain when parts of the shoulder are touched. The pain is worse at night or when at rest.  Soreness or aching in the shoulder.  Inability to move the shoulder normally.  Muscle spasms. DIAGNOSIS This condition is diagnosed with a physical  exam and imaging tests, such as an X-ray or MRI. TREATMENT This condition may be treated with:  Treatment of the underlying cause or condition.  Physical therapy. This involves performing exercises to get the shoulder moving again.  Medicine. Medicine may be given to relieve pain, inflammation, or muscle spasms.  Steroid injections into the shoulder joint.  Shoulder manipulation. This is a procedure to move the shoulder into another position. It is done after you are given a medicine to make you fall asleep (general anesthetic). The joint may also be injected with salt water at high pressure to break down scarring.  Surgery. This may be done in severe cases when other treatments have failed. Although most people recover completely from adhesive capsulitis, some may not regain the full movement of the shoulder. HOME CARE INSTRUCTIONS  Take over-the-counter and prescription medicines only as told by your health care provider.  If you are being treated with physical therapy, follow instructions from your physical therapist.  Avoid exercises that put a lot of demand on your shoulder, such as throwing. These exercises can make pain worse.  If directed, apply ice to the injured area:  Put ice in a plastic bag.  Place a towel between your skin and the bag.  Leave the ice on for 20 minutes, 2-3 times per day. SEEK MEDICAL CARE IF:  You develop new symptoms.  Your symptoms get worse.   This information is not intended to replace advice given to you by your health care provider. Make sure you discuss any questions you have with your   health care provider.   Document Released: 08/10/2009 Document Revised: 07/04/2015 Document Reviewed: 02/05/2015 Elsevier Interactive Patient Education 2016 Elsevier Inc.  

## 2016-09-24 ENCOUNTER — Encounter: Payer: Self-pay | Admitting: Family Medicine

## 2016-09-24 ENCOUNTER — Ambulatory Visit (INDEPENDENT_AMBULATORY_CARE_PROVIDER_SITE_OTHER): Payer: BLUE CROSS/BLUE SHIELD | Admitting: Family Medicine

## 2016-09-24 VITALS — BP 118/52 | HR 80 | Temp 99.0°F | Resp 16 | Ht 68.0 in | Wt 166.6 lb

## 2016-09-24 DIAGNOSIS — M12812 Other specific arthropathies, not elsewhere classified, left shoulder: Secondary | ICD-10-CM

## 2016-09-24 DIAGNOSIS — R6889 Other general symptoms and signs: Secondary | ICD-10-CM

## 2016-09-24 DIAGNOSIS — M25512 Pain in left shoulder: Secondary | ICD-10-CM | POA: Diagnosis not present

## 2016-09-24 DIAGNOSIS — J069 Acute upper respiratory infection, unspecified: Secondary | ICD-10-CM | POA: Diagnosis not present

## 2016-09-24 DIAGNOSIS — J029 Acute pharyngitis, unspecified: Secondary | ICD-10-CM

## 2016-09-24 DIAGNOSIS — G8929 Other chronic pain: Secondary | ICD-10-CM

## 2016-09-24 LAB — POCT INFLUENZA A/B
INFLUENZA A, POC: NEGATIVE
Influenza B, POC: NEGATIVE

## 2016-09-24 LAB — POCT RAPID STREP A (OFFICE): Rapid Strep A Screen: NEGATIVE

## 2016-09-24 MED ORDER — MOMETASONE FUROATE 50 MCG/ACT NA SUSP: 2.0000 | Freq: Every day | NASAL | 12 refills | Status: DC

## 2016-09-24 MED ORDER — BENZONATATE 100 MG PO CAPS: 100.0000 mg | ORAL_CAPSULE | Freq: Two times a day (BID) | ORAL | 0 refills | Status: DC | PRN

## 2016-09-24 NOTE — Progress Notes (Signed)
Chief Complaint  Patient presents with  . Cough    dizzines, nasal congestion, diarrhea, sore throat since Sunday     HPI  Pt reports that since Sunday, 3 days ago, he has been having dizziness, nasal congestion, sore throat, postnasal drip.  He also complains of loose stools.  He reports headaches and chills No fever He has been taking Mucinex and tylenol flu pills and drinking orange juice He took Tylenol flu pills yesterday at lunch.  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 Arthritis    Shoulders B  . Hyperlipidemia     Current Outpatient Prescriptions  Medication Sig Dispense Refill  . Ascorbic Acid (VITAMIN C) 100 MG tablet Take 100 mg by mouth daily.    . Multiple Vitamin (MULTIVITAMIN) tablet Take 1 tablet by mouth daily. Reported on 10/14/2015    . benzonatate (TESSALON) 100 MG capsule Take 1 capsule (100 mg total) by mouth 2 (two) times daily as needed for cough. 30 capsule 0  . diclofenac (VOLTAREN) 75 MG EC tablet Take 1 tablet (75 mg total) by mouth 2 (two) times daily. (Patient not taking: Reported on 09/24/2016) 30 tablet 0  . mometasone (NASONEX) 50 MCG/ACT nasal spray Place 2 sprays into the nose daily. 17 g 12   No current facility-administered medications for this visit.     Allergies:  Allergies  Allergen Reactions  . Codeine Nausea Only    Dizziness    Past Surgical History:  Procedure Laterality Date  . ARTHOSCOPIC ROTAOR CUFF REPAIR     right shoulder    Social History   Social History  . Marital status: Married    Spouse name: N/A  . Number of children: N/A  . Years of education: N/A   Occupational History  . Restaurant manager, fast fooddrill operator    Social History Main Topics  . Smoking status: Never Smoker  . Smokeless tobacco: Never Used  .  Alcohol use 0.0 oz/week  . Drug use: No  . Sexual activity: Yes   Other Topics Concern  . None   Social History Narrative   Marital status: married since 01/2015; second marriage      Children: none      Lives: with wife      Employment: Restaurant manager, fast fooddrill operator for [employer]      Tobacco; none      Alcohol: 7 beers per week; one DUI in 1990s      Drugs: none      Exercise:  Physical job.      Seatbelt: 100% of time     Caffienated drinks-no      Smoke alarm in the home-yes     firearms/guns in the home-no   History of physical abuse-no                ROS See hpi  Objective: Vitals:   09/24/16 1118  BP: (!) 118/52  Pulse: 80  Resp: 16  Temp: 99 F (37.2 C)  TempSrc: Oral  SpO2: 98%  Weight: 166 lb 9.6 oz (75.6 kg)  Height: 5\' 8"  (1.727 m)    Physical Exam General: alert, oriented, in NAD Head: normocephalic, atraumatic, no sinus tenderness Eyes: EOM intact, no scleral icterus or  conjunctival injection Ears: TM clear bilaterally Nose: erythema of the nares with congestion Throat: no pharyngeal exudate or erythema Lymph: no posterior auricular, submental or cervical lymph adenopathy Heart: normal rate, normal sinus rhythm, no murmurs Lungs: clear to auscultation bilaterally, no wheezing    Assessment and Plan Alec Chavez was seen today for cough.  Diagnoses and all orders for this visit:  Flu-like symptoms- negative for flu -     POCT Influenza A/B  Sore throat- negative for strep -     POCT rapid strep A -     Culture, Group A Strep  Chronic left shoulder pain Rotator cuff arthropathy, left -     AMB referral to orthopedics Reviewed the previous MRI and xray Reviewed previous medications Pt already tried Mobic, Voltaren, Gabapentin without any improvement Had cortisone shots  Also did PT Has never seen Orthopedics  Acute URI- advised tessalon and nasonex  Other orders -     mometasone (NASONEX) 50 MCG/ACT nasal spray; Place 2 sprays into the nose  daily. -     benzonatate (TESSALON) 100 MG capsule; Take 1 capsule (100 mg total) by mouth 2 (two) times daily as needed for cough.     Alec Chavez

## 2016-09-24 NOTE — Patient Instructions (Addendum)
     IF you received an x-ray today, you will receive an invoice from [facility]. Please contact [facility] at [phone] with questions or concerns regarding your invoice.   IF you received labwork today, you will receive an invoice from [facility]. Please contact [facility] at [phone] with questions or concerns regarding your invoice.   Our billing staff will not be able to assist you with questions regarding bills from these companies.  You will be contacted with the lab results as soon as they are available. The fastest way to get your results is to activate your My Chart account. Instructions are located on the last page of this paperwork. If you have not heard from us regarding the results in 2 weeks, please contact this office.      Shoulder Pain Many things can cause shoulder pain, including:  An injury to the area.  Overuse of the shoulder.  Arthritis. The source of the pain can be:  Inflammation.  An injury to the shoulder joint.  An injury to a tendon, ligament, or bone. Follow these instructions at home: Take these actions to help with your pain:  Squeeze a soft ball or a foam pad as much as possible. This helps to keep the shoulder from swelling. It also helps to strengthen the arm.  Take over-the-counter and prescription medicines only as told by your health care provider.  If directed, apply ice to the area:  Put ice in a plastic bag.  Place a towel between your skin and the bag.  Leave the ice on for 20 minutes, 2-3 times per day. Stop applying ice if it does not help with the pain.  If you were given a shoulder sling or immobilizer:  Wear it as told.  Remove it to shower or bathe.  Move your arm as little as possible, but keep your hand moving to prevent swelling. Contact a health care provider if:  Your pain gets worse.  Your pain is not relieved with medicines.  New pain develops in your  arm, hand, or fingers. Get help right away if:  Your arm, hand, or fingers:  Tingle.  Become numb.  Become swollen.  Become painful.  Turn white or blue. This information is not intended to replace advice given to you by your health care provider. Make sure you discuss any questions you have with your health care provider. Document Released: 07/23/2005 Document Revised: 06/08/2016 Document Reviewed: 02/05/2015 Elsevier Interactive Patient Education  2017 [employer]

## 2016-09-25 ENCOUNTER — Encounter: Payer: Self-pay | Admitting: Family Medicine

## 2016-09-26 LAB — CULTURE, GROUP A STREP: ORGANISM ID, BACTERIA: NORMAL

## 2018-07-26 ENCOUNTER — Ambulatory Visit (INDEPENDENT_AMBULATORY_CARE_PROVIDER_SITE_OTHER): Payer: BLUE CROSS/BLUE SHIELD | Admitting: Family Medicine

## 2018-07-26 ENCOUNTER — Other Ambulatory Visit: Payer: Self-pay

## 2018-07-26 ENCOUNTER — Encounter: Payer: Self-pay | Admitting: Family Medicine

## 2018-07-26 VITALS — BP 138/70 | HR 72 | Temp 98.4°F | Resp 16 | Ht 68.0 in | Wt 171.6 lb

## 2018-07-26 DIAGNOSIS — J069 Acute upper respiratory infection, unspecified: Secondary | ICD-10-CM

## 2018-07-26 MED ORDER — MOMETASONE FUROATE 50 MCG/ACT NA SUSP: 2.0000 | Freq: Every day | NASAL | 12 refills | Status: DC

## 2018-07-26 NOTE — Addendum Note (Signed)
Addended by: Collie Siad A on: 07/26/2018 04:44 PM   Modules accepted: Orders

## 2018-07-26 NOTE — Progress Notes (Signed)
Chief Complaint  Patient presents with  . Cough    x thursday, in bed friday,saturday and sunday and didn't work today.  Mucinex for symptoms and helped alittle.  Guys at work sick and coughing and not covering mouths and pt thinks he has caught something.   No fevers present.    HPI Onset of symptoms Friday (3 days ago) He took mucinex which helped Missed work today Pt reports that when he is breathing he feels fine He does not feel pressure when he takes a deep breath He does not have any wheezing He has a slight cough    Nonsmoker No asthma Sick contacts include coworkers who cough without covering their mouths  Past Medical History:  Diagnosis Date  . Arthritis    Shoulders B  . Hyperlipidemia     Current Outpatient Medications  Medication Sig Dispense Refill  . Ascorbic Acid (VITAMIN C) 100 MG tablet Take 100 mg by mouth daily.    Marland Kitchen Fe Bisgly-Succ-C-Thre-B12-FA (IRON-150 PO) Take by mouth.    . Omega-3 Fatty Acids (FISH OIL) 1000 MG CPDR Take by mouth.     No current facility-administered medications for this visit.     Allergies:  Allergies  Allergen Reactions  . Codeine Nausea Only    Dizziness    Past Surgical History:  Procedure Laterality Date  . ARTHOSCOPIC ROTAOR CUFF REPAIR     right shoulder    Social History   Socioeconomic History  . Marital status: Married    Spouse name: Not on file  . Number of children: Not on file  . Years of education: Not on file  . Highest education level: Not on file  Occupational History  . Occupation: Restaurant manager, fast food  Social Needs  . Financial resource strain: Not on file  . Food insecurity:    Worry: Not on file    Inability: Not on file  . Transportation needs:    Medical: Not on file    Non-medical: Not on file  Tobacco Use  . Smoking status: Never Smoker  . Smokeless tobacco: Never Used  Substance and Sexual Activity  . Alcohol use: Yes    Alcohol/week: 0.0 standard drinks  . Drug use: No  .  Sexual activity: Yes  Lifestyle  . Physical activity:    Days per week: Not on file    Minutes per session: Not on file  . Stress: Not on file  Relationships  . Social connections:    Talks on phone: Not on file    Gets together: Not on file    Attends religious service: Not on file    Active member of club or organization: Not on file    Attends meetings of clubs or organizations: Not on file    Relationship status: Not on file  Other Topics Concern  . Not on file  Social History Narrative   Marital status: married since 01/2015; second marriage      Children: none      Lives: with wife      Employment: Restaurant manager, fast food for [employer]      Tobacco; none      Alcohol: 7 beers per week; one DUI in 1990s      Drugs: none      Exercise:  Physical job.      Seatbelt: 100% of time     Caffienated drinks-no      Smoke alarm in the home-yes     firearms/guns in the home-no  History of physical abuse-no             Family History  Problem Relation Age of Onset  . Heart disease Mother 36       AMI  . Arthritis Father        DDD lumbar spine  . Cancer Neg Hx   . Hyperlipidemia Neg Hx   . Hypertension Neg Hx   . Diabetes Neg Hx      ROS Review of Systems See HPI Constitution: No fevers or chills No malaise No diaphoresis Skin: No rash or itching Eyes: no blurry vision, no double vision GU: no dysuria or hematuria Neuro: no dizziness or headaches all others reviewed and negative   Objective: Vitals:   07/26/18 1625  BP: 138/70  Pulse: 72  Resp: 16  Temp: 98.4 F (36.9 C)  TempSrc: Oral  SpO2: 99%  Weight: 171 lb 9.6 oz (77.8 kg)  Height: 5\' 8"  (1.727 m)    Physical Exam General: alert, oriented, in NAD Head: normocephalic, atraumatic, no sinus tenderness Eyes: EOM intact, no scleral icterus or conjunctival injection Ears: TM clear bilaterally Nose: mucosa nonerythematous, nonedematous Throat: no pharyngeal exudate or erythema Lymph: no posterior  auricular, submental or cervical lymph adenopathy Heart: normal rate, normal sinus rhythm, no murmurs Lungs: clear to auscultation bilaterally, no wheezing   Assessment and Plan Alec Chavez was seen today for cough.  Diagnoses and all orders for this visit:  Acute URI   Discussed viral etiology Discussed otc decongestant Advised flonase for congestion  Increase hydration Vitamin C and zinc lozenges suggested Return to clinic if symptoms worse   Alec Chavez

## 2018-07-26 NOTE — Patient Instructions (Addendum)
   If you have lab work done today you will be contacted with your lab results within the next 2 weeks.  If you have not heard from us then please contact us. The fastest way to get your results is to register for My Chart.   IF you received an x-ray today, you will receive an invoice from [facility]. Please contact [facility] at [phone] with questions or concerns regarding your invoice.   IF you received labwork today, you will receive an invoice from [facility]. Please contact [facility] at [phone] with questions or concerns regarding your invoice.   Our billing staff will not be able to assist you with questions regarding bills from these companies.  You will be contacted with the lab results as soon as they are available. The fastest way to get your results is to activate your My Chart account. Instructions are located on the last page of this paperwork. If you have not heard from us regarding the results in 2 weeks, please contact this office.     Viral Respiratory Infection A respiratory infection is an illness that affects part of the respiratory system, such as the lungs, nose, or throat. Most respiratory infections are caused by either viruses or bacteria. A respiratory infection that is caused by a virus is called a viral respiratory infection. Common types of viral respiratory infections include:  A cold.  The flu (influenza).  A respiratory syncytial virus (RSV) infection.  How do I know if I have a viral respiratory infection? Most viral respiratory infections cause:  A stuffy or runny nose.  Yellow or green nasal discharge.  A cough.  Sneezing.  Fatigue.  Achy muscles.  A sore throat.  Sweating or chills.  A fever.  A headache.  How are viral respiratory infections treated? If influenza is diagnosed early, it may be treated with an antiviral medicine that shortens the length of time a person has symptoms. Symptoms of viral  respiratory infections may be treated with over-the-counter and prescription medicines, such as:  Expectorants. These make it easier to cough up mucus.  Decongestant nasal sprays.  Health care providers do not prescribe antibiotic medicines for viral infections. This is because antibiotics are designed to kill bacteria. They have no effect on viruses. How do I know if I should stay home from work or school? To avoid exposing others to your respiratory infection, stay home if you have:  A fever.  A persistent cough.  A sore throat.  A runny nose.  Sneezing.  Muscles aches.  Headaches.  Fatigue.  Weakness.  Chills.  Sweating.  Nausea.  Follow these instructions at home:  Rest as much as possible.  Take over-the-counter and prescription medicines only as told by your health care provider.  Drink enough fluid to keep your urine clear or pale yellow. This helps prevent dehydration and helps loosen up mucus.  Gargle with a salt-water mixture 3-4 times per day or as needed. To make a salt-water mixture, completely dissolve -1 tsp of salt in 1 cup of warm water.  Use nose drops made from salt water to ease congestion and soften raw skin around your nose.  Do not drink alcohol.  Do not use tobacco products, including cigarettes, chewing tobacco, and e-cigarettes. If you need help quitting, ask your health care provider. Contact a health care provider if:  Your symptoms last for 10 days or longer.  Your symptoms get worse over time.  You have a fever.  You   have severe sinus pain in your face or forehead.  The glands in your jaw or neck become very swollen. Get help right away if:  You feel pain or pressure in your chest.  You have shortness of breath.  You faint or feel like you will faint.  You have severe and persistent vomiting.  You feel confused or disoriented. This information is not intended to replace advice given to you by your health care  provider. Make sure you discuss any questions you have with your health care provider. Document Released: 07/23/2005 Document Revised: 03/20/2016 Document Reviewed: 03/21/2015 Elsevier Interactive Patient Education  2018 Elsevier Inc.  

## 2018-07-27 ENCOUNTER — Telehealth: Payer: Self-pay | Admitting: Family Medicine

## 2018-07-27 ENCOUNTER — Encounter: Payer: Self-pay | Admitting: *Deleted

## 2018-07-27 NOTE — Telephone Encounter (Signed)
Patient called letter at 102 for pick up

## 2018-07-27 NOTE — Telephone Encounter (Signed)
Copied from CRM [id number]. Topic: Inquiry >> Jul 27, 2018  9:17 AM Crist Infante wrote: Reason for CRM: pt saw Dr Creta Levin yesterday, got a work note to be out yesterday, but states he does not feel well enough to go back into work today either.  Requesting a note to be out today as well. Still has congestion, cough, watery eyes, light headed. Just could not make it today to work Please call when he can pick up

## 2018-10-11 ENCOUNTER — Ambulatory Visit (INDEPENDENT_AMBULATORY_CARE_PROVIDER_SITE_OTHER): Payer: BLUE CROSS/BLUE SHIELD

## 2018-10-11 ENCOUNTER — Encounter: Payer: Self-pay | Admitting: Family Medicine

## 2018-10-11 ENCOUNTER — Ambulatory Visit (INDEPENDENT_AMBULATORY_CARE_PROVIDER_SITE_OTHER): Payer: BLUE CROSS/BLUE SHIELD | Admitting: Family Medicine

## 2018-10-11 ENCOUNTER — Other Ambulatory Visit: Payer: Self-pay

## 2018-10-11 VITALS — BP 151/87 | HR 74 | Temp 98.9°F | Resp 16 | Ht 68.0 in | Wt 169.6 lb

## 2018-10-11 DIAGNOSIS — Z23 Encounter for immunization: Secondary | ICD-10-CM | POA: Diagnosis not present

## 2018-10-11 DIAGNOSIS — S46911A Strain of unspecified muscle, fascia and tendon at shoulder and upper arm level, right arm, initial encounter: Secondary | ICD-10-CM

## 2018-10-11 DIAGNOSIS — S46011A Strain of muscle(s) and tendon(s) of the rotator cuff of right shoulder, initial encounter: Secondary | ICD-10-CM | POA: Diagnosis not present

## 2018-10-11 DIAGNOSIS — M25511 Pain in right shoulder: Secondary | ICD-10-CM | POA: Diagnosis not present

## 2018-10-11 MED ORDER — MELOXICAM 15 MG PO TABS: 15.0000 mg | ORAL_TABLET | Freq: Every day | ORAL | 0 refills | Status: DC

## 2018-10-11 MED ORDER — TRAMADOL HCL 50 MG PO TABS: 50.0000 mg | ORAL_TABLET | Freq: Four times a day (QID) | ORAL | 0 refills | Status: DC | PRN

## 2018-10-11 NOTE — Progress Notes (Signed)
Subjective:    Patient: Alec Chavez  DOB: 03/06/1961; 57 y.o.   MRN: [id number]  Chief Complaint  Patient presents with  . Shoulder Pain    right shoulder pain post injury. was reaching behind me and heard something pop in right shoulder and now can not lift right arm too high above my head    HPI See abive  Used to get cortisone injections. Had surgery long ago on that surgery for bone spurs.  Medical History Past Medical History:  Diagnosis Date  . Arthritis    Shoulders B  . Hyperlipidemia    Past Surgical History:  Procedure Laterality Date  . ARTHOSCOPIC ROTAOR CUFF REPAIR     right shoulder   Current Outpatient Medications on File Prior to Visit  Medication Sig Dispense Refill  . Ascorbic Acid (VITAMIN C) 100 MG tablet Take 100 mg by mouth daily.    Marland Kitchen. aspirin 81 MG chewable tablet Chew 81 mg by mouth daily.    Marland Kitchen. Fe Bisgly-Succ-C-Thre-B12-FA (IRON-150 PO) Take by mouth.    . mometasone (NASONEX) 50 MCG/ACT nasal spray Place 2 sprays into the nose daily. 17 g 12  . naproxen sodium (ALEVE) 220 MG tablet Take 220 mg by mouth.    . Omega-3 Fatty Acids (FISH OIL) 1000 MG CPDR Take by mouth.     No current facility-administered medications on file prior to visit.    Allergies  Allergen Reactions  . Codeine Nausea Only    Dizziness   Family History  Problem Relation Age of Onset  . Heart disease Mother 5330       AMI  . Arthritis Father        DDD lumbar spine  . Cancer Neg Hx   . Hyperlipidemia Neg Hx   . Hypertension Neg Hx   . Diabetes Neg Hx    Social History   Socioeconomic History  . Marital status: Married    Spouse name: Not on file  . Number of children: Not on file  . Years of education: Not on file  . Highest education level: Not on file  Occupational History  . Occupation: Restaurant manager, fast fooddrill operator  Social Needs  . Financial resource strain: Not on file  . Food insecurity:    Worry: Not on file    Inability: Not on file  . Transportation needs:   Medical: Not on file    Non-medical: Not on file  Tobacco Use  . Smoking status: Never Smoker  . Smokeless tobacco: Never Used  Substance and Sexual Activity  . Alcohol use: Yes    Alcohol/week: 0.0 standard drinks  . Drug use: No  . Sexual activity: Yes  Lifestyle  . Physical activity:    Days per week: Not on file    Minutes per session: Not on file  . Stress: Not on file  Relationships  . Social connections:    Talks on phone: Not on file    Gets together: Not on file    Attends religious service: Not on file    Active member of club or organization: Not on file    Attends meetings of clubs or organizations: Not on file    Relationship status: Not on file  Other Topics Concern  . 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      Seatbelt: 100% of time     Caffienated drinks-no      Smoke alarm in the home-yes     firearms/guns in the home-no   History of physical abuse-no            Depression screen [facility] 2/9 10/11/2018 07/26/2018 09/24/2016 07/19/2016 06/03/2016  Decreased Interest 0 0 0 0 0  Down, Depressed, Hopeless 0 0 0 0 0  PHQ - 2 Score 0 0 0 0 0    ROS As noted in HPI  Objective:  BP (!) 151/87 (BP Location: Right Arm, Patient Position: Sitting, Cuff Size: Large)   Pulse 74   Temp 98.9 F (37.2 C) (Oral)   Resp 16   Ht 5\' 8"  (1.727 m)   Wt 169 lb 9.6 oz (76.9 kg)   SpO2 98%   BMI 25.79 kg/m  Physical Exam Constitutional:      General: He is not in acute distress.    Appearance: He is well-developed. He is not diaphoretic.  HENT:     Head: Normocephalic and atraumatic.  Eyes:     General: No scleral icterus. Pulmonary:     Effort: Pulmonary effort is normal.  Skin:    General: Skin is  warm and dry.  Neurological:     Mental Status: He is alert and oriented to person, place, and time.  Psychiatric:        Behavior: Behavior normal.    Inspection reveals large bony mass protruberance over right distal acromian. Palpation is normal with no tenderness over AC joint or bicipital groove. ROM is limited  To 100 degrees abduction, moderate limitation in adduction, moderate limitation in flexion/severe in extension. Rotator cuff strength 4/5 in right - 5/5 in left.  Bicep, tricep 5/5 B. positive Neer and Hawkin's tests, positive empty can. + Speeds and Yergason's tests. Normal scapular function observed. + painful arc and + drop arm sign.   POC TESTING No visits with results within 3 Day(s) from this visit.  Latest known visit with results is:  Office Visit on 09/24/2016  Component Date Value Ref Range Status  . Rapid Strep A Screen 09/24/2016 Negative  Negative Final  . Influenza A, POC 09/24/2016 Negative  Negative Final  . Influenza B, POC 09/24/2016 Negative  Negative Final  . Organism ID, Bacteria 09/24/2016    Final                   Value:Normal Upper Respiratory Flora No Beta Hemolytic Streptococci Isolated      Assessment & Plan:   1. Acute pain of right shoulder   2. Shoulder strain, right, initial encounter   3. Rotator cuff strain, right, initial encounter   Rest - light duty only, wear sling when relaxing but cont to try to do gentle ROM, no lifting - quesiton if partial tender biceps tear.   Patient will continue on current chronic medications other than changes noted above, so ok to refill when needed.   See after visit summary for patient specific instructions.  Orders Placed This Encounter  Procedures  . DG Shoulder Right    Standing Status:   Future    Number of Occurrences:   1    Standing Expiration Date:   10/11/2019    Order Specific  Question:   Reason for Exam (SYMPTOM  OR DIAGNOSIS REQUIRED)    Answer:   was reaching behind me and  heard something pop in right shoulder and now can not lift right arm too high above my head    Order Specific Question:   Preferred imaging location?    Answer:   External  . Flu Vaccine QUAD 36+ mos IM  . Ambulatory referral to Orthopedic Surgery    Referral Priority:   Urgent    Referral Type:   Surgical    Referral Reason:   Specialty Services Required    Requested Specialty:   Orthopedic Surgery    Number of Visits Requested:   1    Meds ordered this encounter  Medications  . DISCONTD: meloxicam (MOBIC) 15 MG tablet    Sig: Take 1 tablet (15 mg total) by mouth daily.    Dispense:  30 tablet    Refill:  0  . DISCONTD: traMADol (ULTRAM) 50 MG tablet    Sig: Take 1-2 tablets (50-100 mg total) by mouth every 6 (six) hours as needed.    Dispense:  30 tablet    Refill:  0  . traMADol (ULTRAM) 50 MG tablet    Sig: Take 1-2 tablets (50-100 mg total) by mouth every 6 (six) hours as needed.    Dispense:  30 tablet    Refill:  0  . meloxicam (MOBIC) 15 MG tablet    Sig: Take 1 tablet (15 mg total) by mouth daily.    Dispense:  30 tablet    Refill:  0    Patient verbalized to me that they understand the following: diagnosis, what is being done for them, what to expect and what should be done at home.  Their questions have been answered. They understand that I am unable to predict every possible medication interaction or adverse outcome and that if any unexpected symptoms arise, they should contact us and their pharmacist, as well as never hesitate to seek urgent/emergent care at [facility] or ER if they think it might be warranted.    Norberto Sorenson, MD, MPH Primary Care at [facility] [address] [place], [place]  [address] [phone] Office phone  [phone] Office fax  10/11/18 4:20 PM

## 2018-10-11 NOTE — Patient Instructions (Addendum)
If you have lab work done today you will be contacted with your lab results within the next 2 weeks.  If you have not heard from us then please contact us. The fastest way to get your results is to register for My Chart.   IF you received an x-ray today, you will receive an invoice from [facility]. Please contact [facility] at [phone] with questions or concerns regarding your invoice.   IF you received labwork today, you will receive an invoice from [facility]. Please contact [facility] at [phone] with questions or concerns regarding your invoice.   Our billing staff will not be able to assist you with questions regarding bills from these companies.  You will be contacted with the lab results as soon as they are available. The fastest way to get your results is to activate your My Chart account. Instructions are located on the last page of this paperwork. If you have not heard from us regarding the results in 2 weeks, please contact this office.     How to Use a Sling A sling is a type of hanging bandage that is worn around your neck to protect an injured arm, shoulder, or other body part. You may need to wear a sling to keep you from moving (immobilize) the injured body part while it heals. Keeping the injured part of your body still reduces pain and speeds up healing. Your health care provider may recommend using a sling if you have:  A broken arm.  A broken collarbone.  A shoulder injury.  Surgery.  What are the risks? Wearing a sling the wrong way can:  Make your injury worse.  Cause stiffness or numbness.  Affect blood circulation in your arm and hand. This can causetingling or numbness in your fingers or hands.  How to use a sling The way that you should use a sling depends on your injury. It is important that you follow all of your health care provider's instructions for your injury. Also follow these general guidelines:  Wear the sling so  that your arm bends 90 degrees at the elbow. That is like a right angle or the shape of a capital letter "L." The sling should also support your wrist and your hand.  Try to avoid moving your arm.  Do not lie down flat on your back while wearing a sling. Sleep in a recliner or use pillows to raise your upper body in bed.  Do not twist, raise, or move your arm in a way that could make your injury worse.  Do not lean on your arm while wearing a sling.  Do not lift anything while wearing a sling.  Contact a health care provider if:  You have bruising, swelling, or pain that is getting worse.  Your pain medicine is not helping.  You have a fever. Get help right away if:  Your fingers are numb or tingling.  Your fingers turn blue or feel cold to the touch.  You cannot control the bleeding from your injury.  You are short of breath. This information is not intended to replace advice given to you by your health care provider. Make sure you discuss any questions you have with your health care provider. Document Released: 05/27/2004 Document Revised: 03/20/2016 Document Reviewed: 08/16/2014 Elsevier Interactive Patient Education  2018 Elsevier Inc[employer]   RICE for Routine Care of Injuries Theroutine careofmanyinjuriesincludes rest, ice, compression, and elevation (RICE therapy). RICE therapy is often recommended for injuries  to soft tissues, such as a muscle strain, ligament injuries, bruises, and overuse injuries. It can also be used for some bony injuries. Using RICE therapy can help to relieve pain, lessen swelling, and enable your body to heal. Rest Rest is required to allow your body to heal. This usually involves reducing your normal activities and avoiding use of the injured part of your body. Generally, you can return to your normal activities when you are comfortable and have been given permission by your health care provider. Follow these instructions at home: Ice  Icing  your injury helps to keep the swelling down, and it lessens pain. Do not apply ice directly to your skin.  Put ice in a plastic bag.  Place a towel between your skin and the bag.  Leave the ice on for 20 minutes, 2-3 times a day.  Do this for as long as you are directed by your health care provider. Compression Compression means putting pressure on the injured area. Compression helps to keep swelling down, gives support, and helps with discomfort. Compression may be done with an elastic bandage. If an elastic bandage has been applied, follow these general tips:  Remove and reapply the bandage every 3-4 hours or as directed by your health care provider.  Make sure the bandage is not wrapped too tightly, because this can cut off circulation. If part of your body beyond the bandage becomes blue, numb, cold, swollen, or more painful, your bandage is most likely too tight. If this occurs, remove your bandage and reapply it more loosely.  See your health care provider if the bandage seems to be making your problems worse rather than better.  Elevation  Elevation means keeping the injured area raised. This helps to lessen swelling and decrease pain. If possible, your injured area should be elevated at or above the level of your heart or the center of your chest. When should I seek medical care?  If your pain and swelling continue.  If your symptoms are getting worse rather than improving. These symptoms may indicate that further evaluation or further X-rays are needed. Sometimes, X-rays may not show a small broken bone (fracture) until a number of days later. Make a follow-up appointment with your health care provider. When should I seek immediate medical care?  If you have sudden severe pain at or below the area of your injury.  If you have redness or increased swelling around your injury.  If you have tingling or numbness at or below the area of your injury that does not improve after you  remove the elastic bandage. This information is not intended to replace advice given to you by your health care provider. Make sure you discuss any questions you have with your health care provider. Document Released: 01/25/2001 Document Revised: 11/24/2016 Document Reviewed: 09/20/2014 Elsevier Interactive Patient Education  2018 Elsevier Inc.   Biceps Tendon Disruption (Proximal) The proximal biceps tendon is a strong cord of tissue that connects the biceps muscle, on the front of the upper arm, to the shoulder blade. A proximal biceps tendon disruption can include a partial or complete tear of the tendon near where it connects to the bone near the shoulder. A proximal biceps tendon disruption can interfere with the ability to lift the arm in front of the body, stabilize the shoulder, bend the elbow, and turn the hand palm-up (supination). What are the causes? A biceps tendon disruption happens when the tendon is exposed to too much force. This excess  force may be caused by:  The elbow being suddenly straightened from a bent position because of an external force. This could happen, for example, while catching a heavy weight or being pulled when waterskiing.  Wear and tear from physical activity.  Breaking a fall with your hand.  What increases the risk? The following factors may make you more likely to develop this condition:  Playing contact sports.  Doing activities or sports that involve throwing or overhead movements, such as racket sports, gymnastics, or baseball.  Doing activities or sports that involve putting sudden force on the arm, such as weightlifting or waterskiing.  Having a weakened tendon. The tendon may be weak because of: ? Long-lasting (chronic) biceps tendinitis. ? Certain medical conditions, such as diabetes or rheumatoid arthritis. ? Repeated corticosteroid use. ? Repetitive overhead movements.  What are the signs or symptoms? Symptoms of this condition may  include:  Sudden sharp pain in the front of the shoulder. Pain may get worse during certain movements, such as: ? Lifting or carrying objects. ? Straightening the elbow. ? Throwing or using overhead movements.  Inflammation or a feeling of unusual warmth on the front of the shoulder.  Painful tightening (spasm) of the biceps muscle.  A bulge on the inside of the upper arm when the elbow is bent.  Bruising in the shoulder or upper arm. This may develop 24-48 hours after the tendon is injured.  Limited range of motion of the shoulder and elbow.  Weakness in the elbow and forearm when: ? Bending the elbow. ? Rotating the wrist.  How is this diagnosed? This condition is diagnosed based on your symptoms, your medical history, and a physical exam. Your health care provider may test the strength and range of motion of your shoulder and elbow. You may have imaging tests, such as X-rays, MRI, or ultrasound. How is this treated? This condition is treated by resting and icing the injured area, and by doing physical therapy exercises. Depending on the severity of your condition, treatment may also include:  Medicines to help relieve pain and inflammation.  Avoiding certain activities that put stress on your shoulder.  One or more injections of medicines (corticosteroids) into your upper arm to help reduce inflammation (rare).  Surgery to repair the tear. This may be needed if nonsurgical treatments do not improve your condition.  Follow these instructions at home: Managing pain, stiffness, and swelling  If directed, put ice on the injured area: ? Put ice in a plastic bag. ? Place a towel between your skin and the bag. ? Leave the ice on for 20 minutes, 2-3 times a day.  Move your fingers often to avoid stiffness and to lessen swelling.  Raise (elevate) the injured area while you are sitting or lying down. Activity  Return to your normal activities as told by your health care  provider. Ask your health care provider what activities are safe for you.  Avoid activities that cause pain or make your condition worse.  Do not lift anything that is heavier than 10 lb (4.5 kg) until your health care provider approves.  Do exercises as told by your health care provider. General instructions  Take over-the-counter and prescription medicines only as told by your health care provider.  Do not drive or operate heavy machinery while taking prescription pain medicines.  Keep all follow-up visits as told by your health care provider. This is important. How is this prevented?  Warm up and stretch before being active.  Cool down and stretch after being active.  Give your body time to rest between periods of activity.  Make sure to use equipment that fits you.  Be safe and responsible while being active to avoid falls.  Maintain physical fitness, including strength and flexibility. Contact a health care provider if:  You have symptoms that get worse or do not get better after 2 weeks of treatment.  You develop new symptoms. Get help right away if:  You have severe pain.  You develop pain or numbness in your hand.  Your hand feels unusually cold.  Your fingernails turn a dark color, such as blue or gray. This information is not intended to replace advice given to you by your health care provider. Make sure you discuss any questions you have with your health care provider. Document Released: 10/13/2005 Document Revised: 06/19/2016 Document Reviewed: 09/21/2015 Elsevier Interactive Patient Education  [employer].

## 2018-10-12 ENCOUNTER — Telehealth: Payer: Self-pay | Admitting: Family Medicine

## 2018-10-12 ENCOUNTER — Ambulatory Visit: Payer: Self-pay | Admitting: *Deleted

## 2018-10-12 NOTE — Telephone Encounter (Signed)
Patient took 2 pain pills last night( spaced out) and he woke up this morning feeling nausea and dizzy. Patient does not feel he can take this pill. He wants to know if there is some other medication he can be prescribed for the pain.  Patient reports as the day has progressed the side effects have gotten better.  Patient wants to be out of work until Friday- he had resisted the idea- but now thinks he does need to rest the shoulder and ice it. Patient would like to get the letter written.  Reason for Disposition . Caller has NON-URGENT medication question about med that PCP prescribed and triager unable to answer question    Patient is requesting a different medication- he feels he is sensitive to the Ultram  Answer Assessment - Initial Assessment Questions 1. SYMPTOMS: "Do you have any symptoms?"     Nausea and dizziness this morning 2. SEVERITY: If symptoms are present, ask "Are they mild, moderate or severe?"     moderate  Protocols used: MEDICATION QUESTION CALL-A-AH  Patient states he took 1 pill last night and a couple hours later he took another- he went to bed and he states he had nausea and dizziness this morning. He thinks he had side effects from the pain medication. He called to make sure it did not have codeine in it- and he wants to know if there is something different he can be prescribed.

## 2018-10-12 NOTE — Telephone Encounter (Signed)
Copied from CRM [phone]. Topic: General - Other >> Oct 12, 2018  7:08 AM Tamela OddiHarris, Brenda J wrote: Reason for CRM: Patient called to request a note from Dr. Clelia CroftShaw to extend his absence from work until Friday.  Patient stated that his shoulder is still hurting and he does not think he can work with it in this condition.  He would like to rest it and come back to work on Friday.  Please advise and call patient back at [phone]

## 2018-10-13 NOTE — Telephone Encounter (Signed)
Patient called in for information on request on yesterday

## 2018-10-14 ENCOUNTER — Telehealth: Payer: Self-pay | Admitting: Family Medicine

## 2018-10-14 ENCOUNTER — Encounter: Payer: Self-pay | Admitting: *Deleted

## 2018-10-14 MED ORDER — HYDROCODONE-ACETAMINOPHEN 5-325 MG PO TABS: 1.0000 | ORAL_TABLET | Freq: Four times a day (QID) | ORAL | 0 refills | Status: DC | PRN

## 2018-10-14 NOTE — Telephone Encounter (Signed)
Tramadol making him sick. Request something else for pain. Meds ordered this encounter  Medications  . HYDROcodone-acetaminophen (NORCO/VICODIN) 5-325 MG tablet    Sig: Take 1-2 tablets by mouth every 6 (six) hours as needed for moderate pain.    Dispense:  30 tablet    Refill:  0   [place] will inform pt.

## 2018-10-14 NOTE — Telephone Encounter (Signed)
Spoke to patient note is up front  Message given to [place] about changing meds.

## 2018-10-14 NOTE — Telephone Encounter (Signed)
Spoke with pt and informed him of a new Rx that has been sent to pharmacy. He verbalized understating.

## 2018-10-14 NOTE — Telephone Encounter (Signed)
YES - 1000% AGREE - PLEASE PROVIDE REQUESTED NOTE.   I DIDN'T WANT HIM TO GO BACK TO WORK BUT PT THOUGHT HE COULD SAFELY PUSH THROUGH - GLAD HE IS LISTENING TO HIS BODY - RECOMMEND STAYING OUT OF WORK UNTIL HE SEES ORTHO. WEAR SLING BUT REMOVE SEVERAL TIMES A DAY FOR GENTLE ROM EXERCISES LIKE THE PENDULUM SWING.

## 2018-12-02 ENCOUNTER — Ambulatory Visit (INDEPENDENT_AMBULATORY_CARE_PROVIDER_SITE_OTHER): Payer: BLUE CROSS/BLUE SHIELD | Admitting: Orthopaedic Surgery

## 2018-12-02 ENCOUNTER — Encounter (INDEPENDENT_AMBULATORY_CARE_PROVIDER_SITE_OTHER): Payer: Self-pay | Admitting: Orthopaedic Surgery

## 2018-12-02 VITALS — BP 174/99 | HR 76 | Ht 68.0 in | Wt 169.0 lb

## 2018-12-02 DIAGNOSIS — G8929 Other chronic pain: Secondary | ICD-10-CM | POA: Diagnosis not present

## 2018-12-02 DIAGNOSIS — M25511 Pain in right shoulder: Secondary | ICD-10-CM

## 2018-12-02 DIAGNOSIS — M25512 Pain in left shoulder: Secondary | ICD-10-CM | POA: Diagnosis not present

## 2018-12-02 MED ORDER — BUPIVACAINE HCL 0.5 % IJ SOLN
2.0000 mL | INTRAMUSCULAR | Status: AC | PRN
  Administered 2018-12-02: 2 mL via INTRA_ARTICULAR

## 2018-12-02 MED ORDER — LIDOCAINE HCL 2 % IJ SOLN
2.0000 mL | INTRAMUSCULAR | Status: AC | PRN
  Administered 2018-12-02: 2 mL

## 2018-12-02 MED ORDER — METHYLPREDNISOLONE ACETATE 40 MG/ML IJ SUSP
40.0000 mg | INTRAMUSCULAR | Status: AC | PRN
  Administered 2018-12-02: 40 mg via INTRA_ARTICULAR

## 2018-12-02 NOTE — Progress Notes (Signed)
Office Visit Note   Patient: Alec Chavez           Date of Birth: [birth year]           MRN: [id number] Visit Date: 12/02/2018              Requested by: Alec Mocha, MD [address] [place], [place] [address] PCP: Patient, No Pcp Per   Assessment & Plan: Visit Diagnoses:  1. Chronic pain of both shoulders     Plan: Bilateral shoulder pain with impingement.  Left is more symptomatic than the left.  Long discussion regarding treatment options at diagnosis.  Will inject the subacromial space of both shoulders and monitor response over the next several weeks.  Consider MRI scan both shoulders depending upon provement or lack thereof  Follow-Up Instructions: Return in about 2 weeks (around 12/16/2018).   Orders:  Orders Placed This Encounter  Procedures  . Large Joint Inj: L subacromial bursa  . Large Joint Inj: R subacromial bursa   No orders of the defined types were placed in this encounter.     Procedures: Large Joint Inj: L subacromial bursa on 12/02/2018 3:58 PM Indications: pain and diagnostic evaluation Details: 25 G 1.5 in needle, anterolateral approach  Arthrogram: No  Medications: 2 mL bupivacaine 0.5 %; 2 mL lidocaine 2 %; 40 mg methylPREDNISolone acetate 40 MG/ML Consent was given by the patient. Immediately prior to procedure a time out was called to verify the correct patient, procedure, equipment, support staff and site/side marked as required. Patient was prepped and draped in the usual sterile fashion.   Large Joint Inj: R subacromial bursa on 12/02/2018 3:58 PM Indications: pain and diagnostic evaluation Details: 25 G 1.5 in needle, anterolateral approach  Arthrogram: No  Medications: 2 mL lidocaine 2 %; 2 mL bupivacaine 0.5 %; 40 mg methylPREDNISolone acetate 40 MG/ML Consent was given by the patient. Immediately prior to procedure a time out was called to verify the correct patient, procedure, equipment, support staff and site/side marked as required.  Patient was prepped and draped in the usual sterile fashion.       Clinical Data: No additional findings.   Subjective: Chief Complaint  Patient presents with  . Right Shoulder - Pain  Patient presents today with right shoulder pain X3 years. He said that three months ago he was reaching behind him to get his dog in a crate and felt something pop in his shoulder. He said that it was numb the rest of the day and had limited mobility. The numbness has went away, but now hurts on the superior aspect. He states that both shoulders have bothered him for years, but the right is worse. He has difficulty sleeping at night. He saw Dr.Shaw in December of 2019 and had right shoulder x-rays. He was given Meloxicam and Norco, but did not like how it made him feel. He is taking Advil or Aleve now. He went to a physical therapy location on [address] three weeks ago to be evaluated. He states that he is no better.  Past history is significant that Alec Chavez had an MRI scan of his right shoulder in 2001 and subsequent surgery to remove "spurs".  He did well for a period of time only to have some recurrence.  He had an acute exacerbation of his pain about 2 months ago when he was placing his right arm behind his back.  He notes that it slowly improved but still having difficulty sleeping on that  side and raising his arm over his head.  He has had some nondescript pain in the left shoulder but not to the extent that he is having on the right.  X-rays were performed of his shoulder recently which I reviewed on the PACS system.  Films were of the right shoulder demonstrated hypertrophic changes about the acromioclavicular joint with a large superior osteophyte off the distal clavicle.  There might be some early degenerative changes in the glenohumeral joint with a small inferior humeral head spur.  Joint spaces are well  maintained HPI  Review of Systems  Constitutional: Positive for fatigue.  HENT: Negative for ear  pain.   Eyes: Negative for pain.  Respiratory: Positive for shortness of breath.   Cardiovascular: Negative for leg swelling.  Gastrointestinal: Negative for constipation and diarrhea.  Endocrine: Negative for cold intolerance and heat intolerance.  Genitourinary: Negative for difficulty urinating.  Musculoskeletal: Negative for joint swelling.  Skin: Negative for rash.  Allergic/Immunologic: Positive for food allergies.  Neurological: Negative for speech difficulty.  Hematological: Does not bruise/bleed easily.  Psychiatric/Behavioral: Positive for sleep disturbance.     Objective: Vital Signs: BP (!) 174/99   Pulse 76   Ht 5\' 8"  (1.727 m)   Wt 169 lb (76.7 kg)   BMI 25.70 kg/m   Physical Exam Constitutional:      Appearance: He is well-developed.  Eyes:     Pupils: Pupils are equal, round, and reactive to light.  Pulmonary:     Effort: Pulmonary effort is normal.  Skin:    General: Skin is warm and dry.  Neurological:     Mental Status: He is alert and oriented to person, place, and time.  Psychiatric:        Behavior: Behavior normal.     Ortho Exam awake alert and oriented x3.  Comfortable sitting.  Does have some discomfort with overhead motion of both shoulders with a circuitous arc but is able to place both arms in full flexion.  Abduction is little uncomfortable at 90 degrees bilaterally.  Minimally positive impingement but positive empty can testing bilaterally.  Some prominence along the anterior aspect of his right shoulder but no fluctuance redness or ecchymosis.  Biceps intact.  Does have some pain over the [facility] joint of the right shoulder where there is a large osteophyte of the distal clavicle directed superiorly.  Negative crossarm test on the right.  Good grip and good release.  Neurologically intact Specialty Comments:  No specialty comments available.  Imaging: No results found.   PMFS History: Patient Active Problem List   Diagnosis Date Noted  .  Pain in joint of left shoulder 05/01/2016  . Benign hematuria 10/24/2015  . Mixed hyperlipidemia 08/21/2011  . Fatigue 06/19/2011   Past Medical History:  Diagnosis Date  . Arthritis    Shoulders B  . Hyperlipidemia     Family History  Problem Relation Age of Onset  . Heart disease Mother 5730       AMI  . Arthritis Father        DDD lumbar spine  . Cancer Neg Hx   . Hyperlipidemia Neg Hx   . Hypertension Neg Hx   . Diabetes Neg Hx     Past Surgical History:  Procedure Laterality Date  . ARTHOSCOPIC ROTAOR CUFF REPAIR     right shoulder   Social History   Occupational History  . Occupation: Restaurant manager, fast fooddrill operator  Tobacco Use  . Smoking status: Never Smoker  . Smokeless  tobacco: Never Used  Substance and Sexual Activity  . Alcohol use: Yes    Alcohol/week: 0.0 standard drinks  . Drug use: No  . Sexual activity: Yes

## 2018-12-06 ENCOUNTER — Ambulatory Visit (INDEPENDENT_AMBULATORY_CARE_PROVIDER_SITE_OTHER): Payer: BLUE CROSS/BLUE SHIELD | Admitting: Family Medicine

## 2018-12-06 ENCOUNTER — Other Ambulatory Visit: Payer: Self-pay

## 2018-12-06 ENCOUNTER — Encounter: Payer: Self-pay | Admitting: Family Medicine

## 2018-12-06 ENCOUNTER — Telehealth (INDEPENDENT_AMBULATORY_CARE_PROVIDER_SITE_OTHER): Payer: Self-pay | Admitting: Orthopaedic Surgery

## 2018-12-06 VITALS — BP 158/88 | HR 95 | Temp 98.6°F | Ht 68.0 in | Wt 168.4 lb

## 2018-12-06 DIAGNOSIS — M25512 Pain in left shoulder: Secondary | ICD-10-CM

## 2018-12-06 DIAGNOSIS — R030 Elevated blood-pressure reading, without diagnosis of hypertension: Secondary | ICD-10-CM | POA: Diagnosis not present

## 2018-12-06 MED ORDER — TRAMADOL HCL 50 MG PO TABS: 50.0000 mg | ORAL_TABLET | Freq: Four times a day (QID) | ORAL | 0 refills | Status: DC | PRN

## 2018-12-06 NOTE — Telephone Encounter (Signed)
Called and spoke with patient. He went to urgent care today and was given Tramadol. He wants to come in tomorrow and see Dr.Whitfield regarding his left shoulder pain. He is unable to lift his arm.

## 2018-12-06 NOTE — Progress Notes (Signed)
2/10/20203:06 PM  Alec Chavez 12/25/1960, 58 y.o. male [id number]  Chief Complaint  Patient presents with  . Shoulder Pain    in the left shoulder, works as a Restaurant manager, fast fooddrill operator. Had shots in both shoulders this past thursday of cortisone and novacaine. The right shoulder is doing better but the left is getting weaker    HPI:   Patient is a 58 y.o. male with past medical history significant for HLP and chronic shoulder pain 2/2 impingement who presents today for left shoulder pain  Patient reports that his right shoulder is much better While he was working on Friday he started feeling is left shoulder was getting tighter Denies any redness or warmth Tried heating pad but got worse Now cant move it at all, causes excruciating pain Patient reports that left injection was painful He has not reached out to ortho Felt malaise on Saturday and Sunday but none today Having some mild headaches, denies any chest pain, SOB, vision changes vicodin made him nauseous  Patient denies any h/o HTN  Chart review: Seen by Dr Lessie DingsWhitefield on 12/02/2018 Had bilateral subacromial bursa injections, anterolateral approach Was told to followup in 2 weeks They are considering MRI Has been referred to PT   Fall Risk  12/06/2018 10/11/2018 07/26/2018 09/24/2016 07/19/2016  Falls in the past year? 0 0 No No No  Number falls in past yr: 0 - - - -  Injury with Fall? 0 - - - -     Depression screen [facility] 2/9 12/06/2018 10/11/2018 07/26/2018  Decreased Interest 0 0 0  Down, Depressed, Hopeless 0 0 0  PHQ - 2 Score 0 0 0    Allergies  Allergen Reactions  . Codeine Nausea Only    Dizziness    Prior to Admission medications   Medication Sig Start Date End Date Taking? Authorizing Provider  Ascorbic Acid (VITAMIN C) 100 MG tablet Take 100 mg by mouth daily.   Yes [provider]  aspirin 81 MG chewable tablet Chew 81 mg by mouth daily.   Yes [provider]  Fe Bisgly-Succ-C-Thre-B12-FA  (IRON-150 PO) Take by mouth.   Yes [provider]  mometasone (NASONEX) 50 MCG/ACT nasal spray Place 2 sprays into the nose daily. 07/26/18  Yes Stallings, Zoe A, MD  naproxen sodium (ALEVE) 220 MG tablet Take 220 mg by mouth.   Yes [provider]  Omega-3 Fatty Acids (FISH OIL) 1000 MG CPDR Take by mouth.   Yes [provider]    Past Medical History:  Diagnosis Date  . Arthritis    Shoulders B  . Hyperlipidemia     Past Surgical History:  Procedure Laterality Date  . ARTHOSCOPIC ROTAOR CUFF REPAIR     right shoulder    Social History   Tobacco Use  . Smoking status: Never Smoker  . Smokeless tobacco: Never Used  Substance Use Topics  . Alcohol use: Yes    Alcohol/week: 0.0 standard drinks    Family History  Problem Relation Age of Onset  . Heart disease Mother 6730       AMI  . Arthritis Father        DDD lumbar spine  . Cancer Neg Hx   . Hyperlipidemia Neg Hx   . Hypertension Neg Hx   . Diabetes Neg Hx     ROS Per hpi  OBJECTIVE:  Blood pressure (!) 158/88, pulse 95, temperature 98.6 F (37 C), temperature source Oral, height 5\' 8"  (1.727 m), weight 168  lb 6.4 oz (76.4 kg), SpO2 98 %. Body mass index is 25.61 kg/m.    Physical Exam  Gen: AAOx3. NAD Right shoulder normal Left shoulder with mild swelling over AC joint, no redness or warmth, very limited ROM, distally NVI   ASSESSMENT and PLAN  1. Acute pain of left shoulder No signs of septic joint. Will do trial of tramadol. Work note given as patient has missed work. To be cleared by ortho with restrictions as deemed necessary. Advised ice and compression. Advised followup with ortho today or tomorrow.   2. Elevated BP without diagnosis of hypertension Review of patients BP shows him to be normotensive when not in pain. RTC precautions given  - Care order/instruction:  Other orders - traMADol (ULTRAM) 50 MG tablet; Take 1 tablet (50 mg total) by mouth every 6 (six)  hours as needed.  Return if symptoms worsen or fail to improve.    Myles Lipps, MD Primary Care at [facility] [address] [place], [place] [address] Ph.  [phone] Fax [phone]

## 2018-12-06 NOTE — Telephone Encounter (Signed)
Patient called stating he had a cortisone injection on 12/02/18 and now is unable to move his left shoulder and experiencing "a lot of pain."  Patient states he is having trouble putting a shirt on.  Please advise.

## 2018-12-07 ENCOUNTER — Encounter (INDEPENDENT_AMBULATORY_CARE_PROVIDER_SITE_OTHER): Payer: Self-pay | Admitting: Orthopaedic Surgery

## 2018-12-07 ENCOUNTER — Ambulatory Visit (INDEPENDENT_AMBULATORY_CARE_PROVIDER_SITE_OTHER): Payer: BLUE CROSS/BLUE SHIELD | Admitting: Orthopaedic Surgery

## 2018-12-07 VITALS — BP 182/100 | HR 86 | Ht 68.0 in | Wt 169.0 lb

## 2018-12-07 DIAGNOSIS — G8929 Other chronic pain: Secondary | ICD-10-CM | POA: Diagnosis not present

## 2018-12-07 DIAGNOSIS — M25512 Pain in left shoulder: Secondary | ICD-10-CM | POA: Diagnosis not present

## 2018-12-07 NOTE — Progress Notes (Deleted)
   Office Visit Note   Patient: Alec Chavez           Date of Birth: 12/27/1960           MRN: [id number] Visit Date: 12/07/2018              Requested by: No referring provider defined for this encounter. PCP: Patient, No Pcp Per   Assessment & Plan: Visit Diagnoses: No diagnosis found.  Plan: ***  Follow-Up Instructions: No follow-ups on file.   Orders:  No orders of the defined types were placed in this encounter.  No orders of the defined types were placed in this encounter.     Procedures: No procedures performed   Clinical Data: No additional findings.   Subjective: Chief Complaint  Patient presents with  . Left Wrist - Routine Post Op  Patient presents today for a 1 week follow up. She is 12days status post left carpal tunnel release. DOS 11/25/2018. She has been using volar wrist splint.  HPI  Review of Systems   Objective: Vital Signs: There were no vitals taken for this visit.  Physical Exam  Ortho Exam  Specialty Comments:  No specialty comments available.  Imaging: No results found.   PMFS History: Patient Active Problem List   Diagnosis Date Noted  . Pain in joint of left shoulder 05/01/2016  . Benign hematuria 10/24/2015  . Mixed hyperlipidemia 08/21/2011  . Fatigue 06/19/2011   Past Medical History:  Diagnosis Date  . Arthritis    Shoulders B  . Hyperlipidemia     Family History  Problem Relation Age of Onset  . Heart disease Mother 58       AMI  . Arthritis Father        DDD lumbar spine  . Cancer Neg Hx   . Hyperlipidemia Neg Hx   . Hypertension Neg Hx   . Diabetes Neg Hx     Past Surgical History:  Procedure Laterality Date  . ARTHOSCOPIC ROTAOR CUFF REPAIR     right shoulder   Social History   Occupational History  . Occupation: Restaurant manager, fast food  Tobacco Use  . Smoking status: Never Smoker  . Smokeless tobacco: Never Used  Substance and Sexual Activity  . Alcohol use: Yes    Alcohol/week: 0.0 standard  drinks  . Drug use: No  . Sexual activity: Yes

## 2018-12-07 NOTE — Addendum Note (Signed)
Addended by: Penne Lash, Otis Dials on: 12/07/2018 01:41 PM   Modules accepted: Orders

## 2018-12-07 NOTE — Progress Notes (Signed)
Office Visit Note   Patient: Alec Chavez           Date of Birth: 07/22/1961           MRN: [id number] Visit Date: 12/07/2018              Requested by: No referring provider defined for this encounter. PCP: Patient, No Pcp Per   Assessment & Plan: Visit Diagnoses:  1. Chronic left shoulder pain      Plan: Alec Chavez was seen last week for evaluation of bilateral shoulder pain.  Injected the right shoulder with cortisone with excellent relief of his pain.  However, he continues to have a problem on the left with inability to raise his arm over his head.  He went to the urgent care yesterday they gave him tramadol but it made him sick.  He also has problems taking hydrocodone.  He also has a note to stay out of work until he is been evaluated.  I will order an MRI scan of the left shoulder and keep him out of work until we get the results Wear the sling follow-Up Instructions: Return after MRI left shoulder.   Orders:  No orders of the defined types were placed in this encounter.  No orders of the defined types were placed in this encounter.     Procedures: No procedures performed   Clinical Data: No additional findings.   Subjective: Chief Complaint  Patient presents with  . Left Shoulder - Follow-up  Patient presents today for follow up of left shoulder pain. He received bilateral cortisone injections 5days ago. His right shoulder is much improved. His left shoulder had increased pain 4days ago and now unable to lift arm without pain. He also states that his arm feels weak. [facility] yesterday and was given Tramadol. He is unable to take that because it makes him nauseated. He has tried Advil, but had no relief with that.   HPI  Review of Systems  HENT: Negative for ear pain.   Eyes: Negative for pain.  Respiratory: Negative for shortness of breath.   Cardiovascular: Negative for leg swelling.  Gastrointestinal: Negative for constipation and  diarrhea.  Endocrine: Positive for cold intolerance.  Genitourinary: Negative for difficulty urinating.  Musculoskeletal: Negative for joint swelling.  Skin: Negative for rash.  Allergic/Immunologic: Negative for food allergies.  Neurological: Positive for weakness.  Hematological: Does not bruise/bleed easily.  Psychiatric/Behavioral: Positive for sleep disturbance.     Objective: Vital Signs: BP (!) 182/100   Pulse 86   Ht 5\' 8"  (1.727 m)   Wt 169 lb (76.7 kg)   BMI 25.70 kg/m   Physical Exam Constitutional:      Appearance: He is well-developed.  Eyes:     Pupils: Pupils are equal, round, and reactive to light.  Pulmonary:     Effort: Pulmonary effort is normal.  Skin:    General: Skin is warm and dry.  Neurological:     Mental Status: He is alert and oriented to person, place, and time.  Psychiatric:        Behavior: Behavior normal.     Ortho Exam awake alert and oriented x3.  No discomfort at rest able to move his right shoulder with little if any discomfort with full overhead motion and negative impingement.  I can fully raise the left arm over his head but he cannot maintain that position.  There was no crepitation.  Skin was intact.  Shoulder was not hot or warm.  Some pain with internal and external rotation of left shoulder in the impingement position.  Good grip and good release.  No distal edema  Specialty Comments:  No specialty comments available.  Imaging: No results found.   PMFS History: Patient Active Problem List   Diagnosis Date Noted  . Pain in joint of left shoulder 05/01/2016  . Benign hematuria 10/24/2015  . Mixed hyperlipidemia 08/21/2011  . Fatigue 06/19/2011   Past Medical History:  Diagnosis Date  . Arthritis    Shoulders B  . Hyperlipidemia     Family History  Problem Relation Age of Onset  . Heart disease Mother 68       AMI  . Arthritis Father        DDD lumbar spine  . Cancer Neg Hx   . Hyperlipidemia Neg Hx   .  Hypertension Neg Hx   . Diabetes Neg Hx     Past Surgical History:  Procedure Laterality Date  . ARTHOSCOPIC ROTAOR CUFF REPAIR     right shoulder   Social History   Occupational History  . Occupation: Restaurant manager, fast food  Tobacco Use  . Smoking status: Never Smoker  . Smokeless tobacco: Never Used  Substance and Sexual Activity  . Alcohol use: Yes    Alcohol/week: 0.0 standard drinks  . Drug use: No  . Sexual activity: Yes

## 2018-12-08 ENCOUNTER — Telehealth (INDEPENDENT_AMBULATORY_CARE_PROVIDER_SITE_OTHER): Payer: Self-pay | Admitting: Orthopaedic Surgery

## 2018-12-08 NOTE — Telephone Encounter (Signed)
Insurance card for MRI scanned in chart. Thank you.

## 2018-12-08 NOTE — Telephone Encounter (Signed)
Patient called checking on his referral for his MRI.  Patient states he is currently out of work and was hoping to schedule the MRI this week.  Patient states if that is not possible, he needs a note saying he can return to work.  Patient also stated that he has a "special insurance card for Labs/MRI/Dental/Vision" which is in his chart.  Patient states his MRI is covered at 100% when pre-scheduled by [employer].  Please advise

## 2018-12-09 ENCOUNTER — Telehealth (INDEPENDENT_AMBULATORY_CARE_PROVIDER_SITE_OTHER): Payer: Self-pay | Admitting: Orthopaedic Surgery

## 2018-12-09 NOTE — Telephone Encounter (Signed)
Patient stated he received a call from [employer] who told him he is not covered for an MRI through that plan.  Patient requested the MRI be submitted through [facility] which he has a primary plan in his name and a secondary plan in his wife Charlene's name.  Please advise.

## 2018-12-09 NOTE — Telephone Encounter (Signed)
[place] with [employer] and requested clinicals to be faxed to [phone] so can review and will contact pt to get him set up for MRI and fax will be sent to [place] advising location.

## 2018-12-09 NOTE — Telephone Encounter (Signed)
Opened in error

## 2018-12-09 NOTE — Telephone Encounter (Signed)
Please see below.

## 2018-12-13 ENCOUNTER — Telehealth (INDEPENDENT_AMBULATORY_CARE_PROVIDER_SITE_OTHER): Payer: Self-pay | Admitting: Orthopaedic Surgery

## 2018-12-13 NOTE — Telephone Encounter (Signed)
IC contacted both insurance as requested by pt Alec Chavez no PA required ref # [id number] and [facility] no PA required ref # [id number]

## 2018-12-13 NOTE — Telephone Encounter (Signed)
Patient called stating his left shoulder is "feeling much better" since the cortisone injection and is requesting a return to work note.  Patient states he is able to move his arm and needs to return to work tomorrow 12/14/18.

## 2018-12-13 NOTE — Telephone Encounter (Signed)
I called patient, letter at front desk. 

## 2018-12-17 ENCOUNTER — Ambulatory Visit
Admission: RE | Admit: 2018-12-17 | Discharge: 2018-12-17 | Disposition: A | Discharge status: Home / Self Care | Payer: BLUE CROSS/BLUE SHIELD | Source: Ambulatory Visit | Attending: Orthopaedic Surgery | Admitting: Orthopaedic Surgery

## 2018-12-17 DIAGNOSIS — M25512 Pain in left shoulder: Secondary | ICD-10-CM

## 2018-12-20 ENCOUNTER — Encounter (INDEPENDENT_AMBULATORY_CARE_PROVIDER_SITE_OTHER): Payer: Self-pay | Admitting: Orthopaedic Surgery

## 2018-12-20 ENCOUNTER — Ambulatory Visit (INDEPENDENT_AMBULATORY_CARE_PROVIDER_SITE_OTHER): Payer: BLUE CROSS/BLUE SHIELD | Admitting: Orthopaedic Surgery

## 2018-12-20 VITALS — BP 153/87 | HR 93 | Ht 68.0 in | Wt 169.0 lb

## 2018-12-20 DIAGNOSIS — M25512 Pain in left shoulder: Secondary | ICD-10-CM

## 2018-12-20 DIAGNOSIS — G8929 Other chronic pain: Secondary | ICD-10-CM

## 2018-12-20 NOTE — Progress Notes (Signed)
Office Visit Note   Patient: Alec Chavez           Date of Birth: 11-09-1960           MRN: [id number] Visit Date: 12/20/2018              Requested by: No referring provider defined for this encounter. PCP: Patient, No Pcp Per   Assessment & Plan: Visit Diagnoses:  1. Chronic left shoulder pain     Plan: MRI scan was performed and compared to a study that was performed in 2006.  There is progressive tearing of the supraspinatus with 3.9 cm of retraction.  There also is high-grade partial tearing of the infraspinatus and subscapularis.  There is medial subluxation of the biceps tendon.  Moderate degenerative changes at the acromial clavicular joint.  Long discussion regarding the MRI scan findings.  I would suggest arthroscopic SCD DCR and then a mini open rotator cuff tear repair with biceps tenodesis.  I have talked about the surgery.  I talked about the time in the sling up to 6 weeks and time out of work with rehab.  Do think that surgery is in his best interest given the significance of the MRI scan.  He needs to think about all the above and get back with [place]  Follow-Up Instructions: Return if symptoms worsen or fail to improve.   Orders:  No orders of the defined types were placed in this encounter.  No orders of the defined types were placed in this encounter.     Procedures: No procedures performed   Clinical Data: No additional findings.   Subjective: Chief Complaint  Patient presents with  . Left Shoulder - Follow-up  Patient presents today for follow up of his left shoulder pain. He had an MRI on 12/17/18 and here today for those results. He said that his shoulder has improved some. He is taking Advil as needed.  HPI  Review of Systems   Objective: Vital Signs: BP (!) 153/87   Pulse 93   Ht 5\' 8"  (1.727 m)   Wt 169 lb (76.7 kg)   BMI 25.70 kg/m   Physical Exam Constitutional:      Appearance: He is well-developed.  Eyes:     Pupils: Pupils are  equal, round, and reactive to light.  Pulmonary:     Effort: Pulmonary effort is normal.  Skin:    General: Skin is warm and dry.  Neurological:     Mental Status: He is alert and oriented to person, place, and time.  Psychiatric:        Behavior: Behavior normal.     Ortho Exam awake alert and oriented x3.  Comfortable sitting.  Considerable weakness with external rotation.  Not within internal rotation with the arm at the side.  Positive speeds sign.  Positive impingement and empty can testing.  Some tenderness over the anterior subacromial region.  Neurovascular exam intact.  Skin intact.  Able to place his arm over his head passively but painful arc with active overhead motion  Specialty Comments:  No specialty comments available.  Imaging: No results found.   PMFS History: Patient Active Problem List   Diagnosis Date Noted  . Pain in joint of left shoulder 05/01/2016  . Benign hematuria 10/24/2015  . Mixed hyperlipidemia 08/21/2011  . Fatigue 06/19/2011   Past Medical History:  Diagnosis Date  . Arthritis    Shoulders B  . Hyperlipidemia     Family History  Problem  Relation Age of Onset  . Heart disease Mother 75       AMI  . Arthritis Father        DDD lumbar spine  . Cancer Neg Hx   . Hyperlipidemia Neg Hx   . Hypertension Neg Hx   . Diabetes Neg Hx     Past Surgical History:  Procedure Laterality Date  . ARTHOSCOPIC ROTAOR CUFF REPAIR     right shoulder   Social History   Occupational History  . Occupation: Restaurant manager, fast food  Tobacco Use  . Smoking status: Never Smoker  . Smokeless tobacco: Never Used  Substance and Sexual Activity  . Alcohol use: Yes    Alcohol/week: 0.0 standard drinks  . Drug use: No  . Sexual activity: Yes

## 2019-08-25 ENCOUNTER — Other Ambulatory Visit: Payer: Self-pay

## 2019-08-25 ENCOUNTER — Ambulatory Visit (INDEPENDENT_AMBULATORY_CARE_PROVIDER_SITE_OTHER): Payer: BC Managed Care – PPO | Admitting: Emergency Medicine

## 2019-08-25 ENCOUNTER — Other Ambulatory Visit: Payer: Self-pay | Admitting: Emergency Medicine

## 2019-08-25 ENCOUNTER — Encounter: Payer: Self-pay | Admitting: Emergency Medicine

## 2019-08-25 VITALS — BP 150/90 | HR 75 | Temp 99.0°F | Resp 16 | Ht 68.0 in | Wt 171.8 lb

## 2019-08-25 DIAGNOSIS — I10 Essential (primary) hypertension: Secondary | ICD-10-CM

## 2019-08-25 DIAGNOSIS — Z1211 Encounter for screening for malignant neoplasm of colon: Secondary | ICD-10-CM

## 2019-08-25 DIAGNOSIS — R42 Dizziness and giddiness: Secondary | ICD-10-CM | POA: Diagnosis not present

## 2019-08-25 LAB — LIPID PANEL
Chol/HDL Ratio: 4.7 ratio (ref 0.0–5.0)
Cholesterol, Total: 217 mg/dL — ABNORMAL HIGH (ref 100–199)
HDL: 46 mg/dL (ref >39)
LDL Chol Calc (NIH): 120 mg/dL — ABNORMAL HIGH (ref 0–99)
Triglycerides: 293 mg/dL — ABNORMAL HIGH (ref 0–149)
VLDL Cholesterol Cal: 51 mg/dL — ABNORMAL HIGH (ref 5–40)

## 2019-08-25 LAB — COMPREHENSIVE METABOLIC PANEL
ALT: 54 IU/L — ABNORMAL HIGH (ref 0–44)
AST: 33 IU/L (ref 0–40)
Albumin/Globulin Ratio: 2 (ref 1.2–2.2)
Albumin: 4.3 g/dL (ref 3.8–4.9)
Alkaline Phosphatase: 63 IU/L (ref 39–117)
BUN/Creatinine Ratio: 13 (ref 9–20)
BUN: 11 mg/dL (ref 6–24)
Bilirubin Total: 0.4 mg/dL (ref 0.0–1.2)
CO2: 23 mmol/L (ref 20–29)
Calcium: 9.4 mg/dL (ref 8.7–10.2)
Chloride: 105 mmol/L (ref 96–106)
Creatinine, Ser: 0.86 mg/dL (ref 0.76–1.27)
GFR calc Af Amer: 111 mL/min/{1.73_m2} (ref >59)
GFR calc non Af Amer: 96 mL/min/{1.73_m2} (ref >59)
Globulin, Total: 2.2 g/dL (ref 1.5–4.5)
Glucose: 104 mg/dL — ABNORMAL HIGH (ref 65–99)
Potassium: 4.3 mmol/L (ref 3.5–5.2)
Sodium: 141 mmol/L (ref 134–144)
Total Protein: 6.5 g/dL (ref 6.0–8.5)

## 2019-08-25 LAB — CBC WITH DIFFERENTIAL/PLATELET
Basophils Absolute: 0 10*3/uL (ref 0.0–0.2)
Basos: 1 %
EOS (ABSOLUTE): 0.1 10*3/uL (ref 0.0–0.4)
Eos: 2 %
Hematocrit: 38.5 % (ref 37.5–51.0)
Hemoglobin: 13.4 g/dL (ref 13.0–17.7)
Immature Grans (Abs): 0 10*3/uL (ref 0.0–0.1)
Immature Granulocytes: 0 %
Lymphocytes Absolute: 1 10*3/uL (ref 0.7–3.1)
Lymphs: 24 %
MCH: 31.1 pg (ref 26.6–33.0)
MCHC: 34.8 g/dL (ref 31.5–35.7)
MCV: 89 fL (ref 79–97)
Monocytes Absolute: 0.4 10*3/uL (ref 0.1–0.9)
Monocytes: 10 %
Neutrophils Absolute: 2.7 10*3/uL (ref 1.4–7.0)
Neutrophils: 63 %
Platelets: 293 10*3/uL (ref 150–450)
RBC: 4.31 x10E6/uL (ref 4.14–5.80)
RDW: 12.4 % (ref 11.6–15.4)
WBC: 4.3 10*3/uL (ref 3.4–10.8)

## 2019-08-25 LAB — HEMOGLOBIN A1C
Est. average glucose Bld gHb Est-mCnc: 111 mg/dL
Hgb A1c MFr Bld: 5.5 % (ref 4.8–5.6)

## 2019-08-25 MED ORDER — AMLODIPINE BESYLATE 5 MG PO TABS: 5.0000 mg | ORAL_TABLET | Freq: Every day | ORAL | 3 refills | Status: DC

## 2019-08-25 MED ORDER — AMLODIPINE BESYLATE 5 MG PO TABS: 5.0000 mg | ORAL_TABLET | Freq: Every day | ORAL | 3 refills | Status: AC

## 2019-08-25 NOTE — Patient Instructions (Addendum)
Start taking 1 baby aspirin 81 mg daily. Start the blood pressure medication as prescribed. Neurology consult requested.  If you have lab work done today you will be contacted with your lab results within the next 2 weeks.  If you have not heard from [place] then please contact us. The fastest way to get your results is to register for My Chart.   IF you received an x-ray today, you will receive an invoice from [facility]. Please contact [facility] at [phone] with questions or concerns regarding your invoice.   IF you received labwork today, you will receive an invoice from [facility]. Please contact [facility] at [phone] with questions or concerns regarding your invoice.   Our billing staff will not be able to assist you with questions regarding bills from these companies.  You will be contacted with the lab results as soon as they are available. The fastest way to get your results is to activate your My Chart account. Instructions are located on the last page of this paperwork. If you have not heard from [place] regarding the results in 2 weeks, please contact this office.     Dizziness Dizziness is a common problem. It makes you feel unsteady or light-headed. You may feel like you are about to pass out (faint). Dizziness can lead to getting hurt if you stumble or fall. Dizziness can be caused by many things, including:  Medicines.  Not having enough water in your body (dehydration).  Illness. Follow these instructions at home: Eating and drinking   Drink enough fluid to keep your pee (urine) clear or pale yellow. This helps to keep you from getting dehydrated. Try to drink more clear fluids, such as water.  Do not drink alcohol.  Limit how much caffeine you drink or eat, if your doctor tells you to do that.  Limit how much salt (sodium) you drink or eat, if your doctor tells you to do that. Activity   Avoid making quick movements. ? When you stand up  from sitting in a chair, steady yourself until you feel okay. ? In the morning, first sit up on the side of the bed. When you feel okay, stand slowly while you hold onto something. Do this until you know that your balance is fine.  If you need to stand in one place for a long time, move your legs often. Tighten and relax the muscles in your legs while you are standing.  Do not drive or use heavy machinery if you feel dizzy.  Avoid bending down if you feel dizzy. Place items in your home so you can reach them easily without leaning over. Lifestyle  Do not use any products that contain nicotine or tobacco, such as cigarettes and e-cigarettes. If you need help quitting, ask your doctor.  Try to lower your stress level. You can do this by using methods such as yoga or meditation. Talk with your doctor if you need help. General instructions  Watch your dizziness for any changes.  Take over-the-counter and prescription medicines only as told by your doctor. Talk with your doctor if you think that you are dizzy because of a medicine that you are taking.  Tell a friend or a family member that you are feeling dizzy. If he or she notices any changes in your behavior, have this person call your doctor.  Keep all follow-up visits as told by your doctor. This is important. Contact a doctor if:  Your dizziness does not go away.  Your dizziness or light-headedness gets worse.  You feel sick to your stomach (nauseous).  You have trouble hearing.  You have new symptoms.  You are unsteady on your feet.  You feel like the room is spinning. Get help right away if:  You throw up (vomit) or have watery poop (diarrhea), and you cannot eat or drink anything.  You have trouble: ? Talking. ? Walking. ? Swallowing. ? Using your arms, hands, or legs.  You feel generally weak.  You are not thinking clearly, or you have trouble forming sentences. A friend or family member may notice this.  You  have: ? Chest pain. ? Pain in your belly (abdomen). ? Shortness of breath. ? Sweating.  Your vision changes.  You are bleeding.  You have a very bad headache.  You have neck pain or a stiff neck.  You have a fever. These symptoms may be an emergency. Do not wait to see if the symptoms will go away. Get medical help right away. Call your local emergency services (911 in the [place] Do not drive yourself to the hospital. Summary  Dizziness makes you feel unsteady or light-headed. You may feel like you are about to pass out (faint).  Drink enough fluid to keep your pee (urine) clear or pale yellow. Do not drink alcohol.  Avoid making quick movements if you feel dizzy.  Watch your dizziness for any changes. This information is not intended to replace advice given to you by your health care provider. Make sure you discuss any questions you have with your health care provider. Document Released: 10/02/2011 Document Revised: 10/16/2017 Document Reviewed: 10/30/2016 Elsevier Patient Education  2020 [employer]  Hypertension, Adult High blood pressure (hypertension) is when the force of blood pumping through the arteries is too strong. The arteries are the blood vessels that carry blood from the heart throughout the body. Hypertension forces the heart to work harder to pump blood and may cause arteries to become narrow or stiff. Untreated or uncontrolled hypertension can cause a heart attack, heart failure, a stroke, kidney disease, and other problems. A blood pressure reading consists of a higher number over a lower number. Ideally, your blood pressure should be below 120/80. The first ("top") number is called the systolic pressure. It is a measure of the pressure in your arteries as your heart beats. The second ("bottom") number is called the diastolic pressure. It is a measure of the pressure in your arteries as the heart relaxes. What are the causes? The exact cause of this condition is  not known. There are some conditions that result in or are related to high blood pressure. What increases the risk? Some risk factors for high blood pressure are under your control. The following factors may make you more likely to develop this condition:  Smoking.  Having type 2 diabetes mellitus, high cholesterol, or both.  Not getting enough exercise or physical activity.  Being overweight.  Having too much fat, sugar, calories, or salt (sodium) in your diet.  Drinking too much alcohol. Some risk factors for high blood pressure may be difficult or impossible to change. Some of these factors include:  Having chronic kidney disease.  Having a family history of high blood pressure.  Age. Risk increases with age.  Race. You may be at higher risk if you are African American.  Gender. Men are at higher risk than women before age 57. After age 57, women are at higher risk than men.  Having obstructive sleep  apnea.  Stress. What are the signs or symptoms? High blood pressure may not cause symptoms. Very high blood pressure (hypertensive crisis) may cause:  Headache.  Anxiety.  Shortness of breath.  Nosebleed.  Nausea and vomiting.  Vision changes.  Severe chest pain.  Seizures. How is this diagnosed? This condition is diagnosed by measuring your blood pressure while you are seated, with your arm resting on a flat surface, your legs uncrossed, and your feet flat on the floor. The cuff of the blood pressure monitor will be placed directly against the skin of your upper arm at the level of your heart. It should be measured at least twice using the same arm. Certain conditions can cause a difference in blood pressure between your right and left arms. Certain factors can cause blood pressure readings to be lower or higher than normal for a short period of time:  When your blood pressure is higher when you are in a health care provider's office than when you are at home, this  is called white coat hypertension. Most people with this condition do not need medicines.  When your blood pressure is higher at home than when you are in a health care provider's office, this is called masked hypertension. Most people with this condition may need medicines to control blood pressure. If you have a high blood pressure reading during one visit or you have normal blood pressure with other risk factors, you may be asked to:  Return on a different day to have your blood pressure checked again.  Monitor your blood pressure at home for 1 week or longer. If you are diagnosed with hypertension, you may have other blood or imaging tests to help your health care provider understand your overall risk for other conditions. How is this treated? This condition is treated by making healthy lifestyle changes, such as eating healthy foods, exercising more, and reducing your alcohol intake. Your health care provider may prescribe medicine if lifestyle changes are not enough to get your blood pressure under control, and if:  Your systolic blood pressure is above 130.  Your diastolic blood pressure is above 80. Your personal target blood pressure may vary depending on your medical conditions, your age, and other factors. Follow these instructions at home: Eating and drinking   Eat a diet that is high in fiber and potassium, and low in sodium, added sugar, and fat. An example eating plan is called the DASH (Dietary Approaches to Stop Hypertension) diet. To eat this way: ? Eat plenty of fresh fruits and vegetables. Try to fill one half of your plate at each meal with fruits and vegetables. ? Eat whole grains, such as whole-wheat pasta, brown rice, or whole-grain bread. Fill about one fourth of your plate with whole grains. ? Eat or drink low-fat dairy products, such as skim milk or low-fat yogurt. ? Avoid fatty cuts of meat, processed or cured meats, and poultry with skin. Fill about one fourth of  your plate with lean proteins, such as fish, chicken without skin, beans, eggs, or tofu. ? Avoid pre-made and processed foods. These tend to be higher in sodium, added sugar, and fat.  Reduce your daily sodium intake. Most people with hypertension should eat less than 1,500 mg of sodium a day.  Do not drink alcohol if: ? Your health care provider tells you not to drink. ? You are pregnant, may be pregnant, or are planning to become pregnant.  If you drink alcohol: ? Limit how much  you use to:  0-1 drink a day for women.  0-2 drinks a day for men. ? Be aware of how much alcohol is in your drink. In the [place] one drink equals one 12 oz bottle of beer (355 mL), one 5 oz glass of wine (148 mL), or one 1 oz glass of hard liquor (44 mL). Lifestyle   Work with your health care provider to maintain a healthy body weight or to lose weight. Ask what an ideal weight is for you.  Get at least 30 minutes of exercise most days of the week. Activities may include walking, swimming, or biking.  Include exercise to strengthen your muscles (resistance exercise), such as Pilates or lifting weights, as part of your weekly exercise routine. Try to do these types of exercises for 30 minutes at least 3 days a week.  Do not use any products that contain nicotine or tobacco, such as cigarettes, e-cigarettes, and chewing tobacco. If you need help quitting, ask your health care provider.  Monitor your blood pressure at home as told by your health care provider.  Keep all follow-up visits as told by your health care provider. This is important. Medicines  Take over-the-counter and prescription medicines only as told by your health care provider. Follow directions carefully. Blood pressure medicines must be taken as prescribed.  Do not skip doses of blood pressure medicine. Doing this puts you at risk for problems and can make the medicine less effective.  Ask your health care provider about side effects or  reactions to medicines that you should watch for. Contact a health care provider if you:  Think you are having a reaction to a medicine you are taking.  Have headaches that keep coming back (recurring).  Feel dizzy.  Have swelling in your ankles.  Have trouble with your vision. Get help right away if you:  Develop a severe headache or confusion.  Have unusual weakness or numbness.  Feel faint.  Have severe pain in your chest or abdomen.  Vomit repeatedly.  Have trouble breathing. Summary  Hypertension is when the force of blood pumping through your arteries is too strong. If this condition is not controlled, it may put you at risk for serious complications.  Your personal target blood pressure may vary depending on your medical conditions, your age, and other factors. For most people, a normal blood pressure is less than 120/80.  Hypertension is treated with lifestyle changes, medicines, or a combination of both. Lifestyle changes include losing weight, eating a healthy, low-sodium diet, exercising more, and limiting alcohol. This information is not intended to replace advice given to you by your health care provider. Make sure you discuss any questions you have with your health care provider. Document Released: 10/13/2005 Document Revised: 06/23/2018 Document Reviewed: 06/23/2018 Elsevier Patient Education  2020 [employer].

## 2019-08-25 NOTE — Progress Notes (Signed)
BP Readings from Last 3 Encounters:  08/25/19 (!) 165/98  12/20/18 (!) 153/87  12/07/18 (!) 182/100   Alec Chavez 58 y.o.   Chief Complaint  Patient presents with  . Dizziness    x 3 days per patient with off balance on the RIGHT side of body     HISTORY OF PRESENT ILLNESS: This is a 58 y.o. male complaining of dizziness that started 3 days ago.  Better today.  Feels off balance on the right side of his body. Denies headache or visual problems.  Denies right-sided weakness.  Denies nausea or vomiting.  Has history of hypertension on no medication.  Denies chest pain or difficulty breathing.  Denies fever or flulike symptoms.  Denies syncope or palpitations.  Denies any other significant symptoms.  HPI   Prior to Admission medications   Medication Sig Start Date End Date Taking? Authorizing Provider  aspirin 81 MG chewable tablet Chew 81 mg by mouth daily.   Yes [provider]  Fe Bisgly-Succ-C-Thre-B12-FA (IRON-150 PO) Take by mouth.   Yes [provider]  Multiple Vitamin (MULTI-VITAMIN DAILY PO) Take by mouth daily.   Yes [provider]  naproxen sodium (ALEVE) 220 MG tablet Take 220 mg by mouth.   Yes [provider]  Omega-3 Fatty Acids (FISH OIL) 1000 MG CPDR Take by mouth.   Yes [provider]  amLODipine (NORVASC) 5 MG tablet Take 1 tablet (5 mg total) by mouth daily. 08/25/19   Georgina Quint, MD  Ascorbic Acid (VITAMIN C) 100 MG tablet Take 100 mg by mouth daily.    [provider]  mometasone (NASONEX) 50 MCG/ACT nasal spray Place 2 sprays into the nose daily. Patient not taking: Reported on 08/25/2019 07/26/18   Doristine Bosworth, MD  traMADol (ULTRAM) 50 MG tablet Take 1 tablet (50 mg total) by mouth every 6 (six) hours as needed. Patient not taking: Reported on 08/25/2019 12/06/18   Myles Lipps, MD    Allergies  Allergen Reactions  . Codeine Nausea Only    Dizziness    Patient Active Problem  List   Diagnosis Date Noted  . Benign hematuria 10/24/2015  . Mixed hyperlipidemia 08/21/2011    Past Medical History:  Diagnosis Date  . Arthritis    Shoulders B  . Hyperlipidemia     Past Surgical History:  Procedure Laterality Date  . ARTHOSCOPIC ROTAOR CUFF REPAIR     right shoulder    Social History   Socioeconomic History  . Marital status: Married    Spouse name: Not on file  . Number of children: Not on file  . Years of education: Not on file  . Highest education level: Not on file  Occupational History  . Occupation: Restaurant manager, fast food  Social Needs  . Financial resource strain: Not on file  . Food insecurity    Worry: Not on file    Inability: Not on file  . Transportation needs    Medical: Not on file    Non-medical: Not on file  Tobacco Use  . Smoking status: Never Smoker  . Smokeless tobacco: Never Used  Substance and Sexual Activity  . Alcohol use: Yes    Alcohol/week: 0.0 standard drinks  . Drug use: No  . Sexual activity: Yes  Lifestyle  . Physical activity    Days per week: Not on file    Minutes per session: Not on file  . Stress: Not on file  Relationships  . Social connections  Talks on phone: Not on file    Gets together: Not on file    Attends religious service: Not on file    Active member of club or organization: Not on file    Attends meetings of clubs or organizations: Not on file    Relationship status: Not on file  . Intimate partner violence    Fear of current or ex partner: Not on file    Emotionally abused: Not on file    Physically abused: Not on file    Forced sexual activity: Not on file  Other Topics Concern  . 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       Seatbelt: 100% of time     Caffienated drinks-no      Smoke alarm in the home-yes     firearms/guns in the home-no   History of physical abuse-no             Family History  Problem Relation Age of Onset  . Heart disease Mother 2       AMI  . Arthritis Father        DDD lumbar spine  . Cancer Neg Hx   . Hyperlipidemia Neg Hx   . Hypertension Neg Hx   . Diabetes Neg Hx      Review of Systems  Constitutional: Negative.  Negative for chills and fever.  HENT: Negative.  Negative for congestion and sore throat.   Eyes: Negative.  Negative for blurred vision and double vision.  Respiratory: Negative.  Negative for cough and shortness of breath.   Cardiovascular: Negative.  Negative for chest pain and palpitations.  Gastrointestinal: Negative.  Negative for abdominal pain, diarrhea, nausea and vomiting.  Genitourinary: Negative.  Negative for hematuria.  Musculoskeletal: Positive for joint pain (Chronic bilateral shoulder pain). Negative for myalgias and neck pain.  Skin: Negative.  Negative for rash.  Neurological: Positive for dizziness. Negative for sensory change, speech change, focal weakness, seizures, loss of consciousness and headaches.  Endo/Heme/Allergies: Negative.   All other systems reviewed and are negative.  Today's Vitals   08/25/19 1013 08/25/19 1050  BP: (!) 165/98 (!) 150/90  Pulse: 75   Resp: 16   Temp: 99 F (37.2 C)   TempSrc: Oral   SpO2: 100%   Weight: 171 lb 12.8 oz (77.9 kg)   Height: 5\' 8"  (1.727 m)    Body mass index is 26.12 kg/m.   Physical Exam Vitals signs reviewed.  Constitutional:      Appearance: Normal appearance.  HENT:     Head: Normocephalic.  Eyes:     Extraocular Movements: Extraocular movements intact.     Conjunctiva/sclera: Conjunctivae normal.     Pupils: Pupils are equal, round, and reactive to light.  Neck:     Musculoskeletal: Normal range of motion and neck supple.     Vascular: No carotid bruit.   Cardiovascular:     Rate and Rhythm: Normal rate and regular rhythm.     Pulses: Normal pulses.     Heart sounds: Normal heart sounds.  Pulmonary:  Effort: Pulmonary effort is normal.     Breath sounds: Normal breath sounds.  Abdominal:     Palpations: Abdomen is soft.     Tenderness: There is no abdominal tenderness.  Musculoskeletal: Normal range of motion.  Skin:    General: Skin is warm and dry.     Capillary Refill: Capillary refill takes less than 2 seconds.  Neurological:     General: No focal deficit present.     Mental Status: He is alert and oriented to person, place, and time.     Cranial Nerves: No cranial nerve deficit.     Sensory: No sensory deficit.     Motor: No weakness.     Coordination: Coordination normal.     Gait: Gait normal.     Deep Tendon Reflexes: Reflexes normal.  Psychiatric:        Mood and Affect: Mood normal.        Behavior: Behavior normal.      ASSESSMENT & PLAN: Ewing was seen today for dizziness.  Diagnoses and all orders for this visit:  Dizziness Comments: Suspected TIA Orders: -     CBC with Differential/Platelet -     Comprehensive metabolic panel -     Hemoglobin A1c -     Lipid panel -     Ambulatory referral to Neurology  Screening for colon cancer -     Cologuard  Essential hypertension -     amLODipine (NORVASC) 5 MG tablet; Take 1 tablet (5 mg total) by mouth daily.  ED precautions given.   Patient Instructions     Start taking 1 baby aspirin 81 mg daily. Start the blood pressure medication as prescribed. Neurology consult requested.  If you have lab work done today you will be contacted with your lab results within the next 2 weeks.  If you have not heard from [place] then please contact us. The fastest way to get your results is to register for My Chart.   IF you received an x-ray today, you will receive an invoice from [facility]. Please contact [facility] at [phone] with  questions or concerns regarding your invoice.   IF you received labwork today, you will receive an invoice from [facility]. Please contact [facility] at [phone] with questions or concerns regarding your invoice.   Our billing staff will not be able to assist you with questions regarding bills from these companies.  You will be contacted with the lab results as soon as they are available. The fastest way to get your results is to activate your My Chart account. Instructions are located on the last page of this paperwork. If you have not heard from [place] regarding the results in 2 weeks, please contact this office.     Dizziness Dizziness is a common problem. It makes you feel unsteady or light-headed. You may feel like you are about to pass out (faint). Dizziness can lead to getting hurt if you stumble or fall. Dizziness can be caused by many things, including:  Medicines.  Not having enough water in your body (dehydration).  Illness. Follow these instructions at home: Eating and drinking   Drink enough fluid to keep your pee (urine) clear or pale yellow. This helps to keep you from getting dehydrated. Try to drink more clear fluids, such as water.  Do not drink alcohol.  Limit how much caffeine you drink or eat, if your doctor tells you to do that.  Limit how much salt (sodium) you drink or  eat, if your doctor tells you to do that. Activity   Avoid making quick movements. ? When you stand up from sitting in a chair, steady yourself until you feel okay. ? In the morning, first sit up on the side of the bed. When you feel okay, stand slowly while you hold onto something. Do this until you know that your balance is fine.  If you need to stand in one place for a long time, move your legs often. Tighten and relax the muscles in your legs while you are standing.  Do not drive or use heavy machinery if you feel dizzy.  Avoid bending down if you feel dizzy. Place items in your home so  you can reach them easily without leaning over. Lifestyle  Do not use any products that contain nicotine or tobacco, such as cigarettes and e-cigarettes. If you need help quitting, ask your doctor.  Try to lower your stress level. You can do this by using methods such as yoga or meditation. Talk with your doctor if you need help. General instructions  Watch your dizziness for any changes.  Take over-the-counter and prescription medicines only as told by your doctor. Talk with your doctor if you think that you are dizzy because of a medicine that you are taking.  Tell a friend or a family member that you are feeling dizzy. If he or she notices any changes in your behavior, have this person call your doctor.  Keep all follow-up visits as told by your doctor. This is important. Contact a doctor if:  Your dizziness does not go away.  Your dizziness or light-headedness gets worse.  You feel sick to your stomach (nauseous).  You have trouble hearing.  You have new symptoms.  You are unsteady on your feet.  You feel like the room is spinning. Get help right away if:  You throw up (vomit) or have watery poop (diarrhea), and you cannot eat or drink anything.  You have trouble: ? Talking. ? Walking. ? Swallowing. ? Using your arms, hands, or legs.  You feel generally weak.  You are not thinking clearly, or you have trouble forming sentences. A friend or family member may notice this.  You have: ? Chest pain. ? Pain in your belly (abdomen). ? Shortness of breath. ? Sweating.  Your vision changes.  You are bleeding.  You have a very bad headache.  You have neck pain or a stiff neck.  You have a fever. These symptoms may be an emergency. Do not wait to see if the symptoms will go away. Get medical help right away. Call your local emergency services (911 in the [place] Do not drive yourself to the hospital. Summary  Dizziness makes you feel unsteady or light-headed. You  may feel like you are about to pass out (faint).  Drink enough fluid to keep your pee (urine) clear or pale yellow. Do not drink alcohol.  Avoid making quick movements if you feel dizzy.  Watch your dizziness for any changes. This information is not intended to replace advice given to you by your health care provider. Make sure you discuss any questions you have with your health care provider. Document Released: 10/02/2011 Document Revised: 10/16/2017 Document Reviewed: 10/30/2016 Elsevier Patient Education  2020 [employer].  Hypertension, Adult High blood pressure (hypertension) is when the force of blood pumping through the arteries is too strong. The arteries are the blood vessels that carry blood from the heart throughout the body. Hypertension forces  the heart to work harder to pump blood and may cause arteries to become narrow or stiff. Untreated or uncontrolled hypertension can cause a heart attack, heart failure, a stroke, kidney disease, and other problems. A blood pressure reading consists of a higher number over a lower number. Ideally, your blood pressure should be below 120/80. The first ("top") number is called the systolic pressure. It is a measure of the pressure in your arteries as your heart beats. The second ("bottom") number is called the diastolic pressure. It is a measure of the pressure in your arteries as the heart relaxes. What are the causes? The exact cause of this condition is not known. There are some conditions that result in or are related to high blood pressure. What increases the risk? Some risk factors for high blood pressure are under your control. The following factors may make you more likely to develop this condition:  Smoking.  Having type 2 diabetes mellitus, high cholesterol, or both.  Not getting enough exercise or physical activity.  Being overweight.  Having too much fat, sugar, calories, or salt (sodium) in your diet.  Drinking too much  alcohol. Some risk factors for high blood pressure may be difficult or impossible to change. Some of these factors include:  Having chronic kidney disease.  Having a family history of high blood pressure.  Age. Risk increases with age.  Race. You may be at higher risk if you are African American.  Gender. Men are at higher risk than women before age 47. After age 71, women are at higher risk than men.  Having obstructive sleep apnea.  Stress. What are the signs or symptoms? High blood pressure may not cause symptoms. Very high blood pressure (hypertensive crisis) may cause:  Headache.  Anxiety.  Shortness of breath.  Nosebleed.  Nausea and vomiting.  Vision changes.  Severe chest pain.  Seizures. How is this diagnosed? This condition is diagnosed by measuring your blood pressure while you are seated, with your arm resting on a flat surface, your legs uncrossed, and your feet flat on the floor. The cuff of the blood pressure monitor will be placed directly against the skin of your upper arm at the level of your heart. It should be measured at least twice using the same arm. Certain conditions can cause a difference in blood pressure between your right and left arms. Certain factors can cause blood pressure readings to be lower or higher than normal for a short period of time:  When your blood pressure is higher when you are in a health care provider's office than when you are at home, this is called white coat hypertension. Most people with this condition do not need medicines.  When your blood pressure is higher at home than when you are in a health care provider's office, this is called masked hypertension. Most people with this condition may need medicines to control blood pressure. If you have a high blood pressure reading during one visit or you have normal blood pressure with other risk factors, you may be asked to:  Return on a different day to have your blood  pressure checked again.  Monitor your blood pressure at home for 1 week or longer. If you are diagnosed with hypertension, you may have other blood or imaging tests to help your health care provider understand your overall risk for other conditions. How is this treated? This condition is treated by making healthy lifestyle changes, such as eating healthy foods, exercising more,  and reducing your alcohol intake. Your health care provider may prescribe medicine if lifestyle changes are not enough to get your blood pressure under control, and if:  Your systolic blood pressure is above 130.  Your diastolic blood pressure is above 80. Your personal target blood pressure may vary depending on your medical conditions, your age, and other factors. Follow these instructions at home: Eating and drinking   Eat a diet that is high in fiber and potassium, and low in sodium, added sugar, and fat. An example eating plan is called the DASH (Dietary Approaches to Stop Hypertension) diet. To eat this way: ? Eat plenty of fresh fruits and vegetables. Try to fill one half of your plate at each meal with fruits and vegetables. ? Eat whole grains, such as whole-wheat pasta, brown rice, or whole-grain bread. Fill about one fourth of your plate with whole grains. ? Eat or drink low-fat dairy products, such as skim milk or low-fat yogurt. ? Avoid fatty cuts of meat, processed or cured meats, and poultry with skin. Fill about one fourth of your plate with lean proteins, such as fish, chicken without skin, beans, eggs, or tofu. ? Avoid pre-made and processed foods. These tend to be higher in sodium, added sugar, and fat.  Reduce your daily sodium intake. Most people with hypertension should eat less than 1,500 mg of sodium a day.  Do not drink alcohol if: ? Your health care provider tells you not to drink. ? You are pregnant, may be pregnant, or are planning to become pregnant.  If you drink alcohol: ? Limit how  much you use to:  0-1 drink a day for women.  0-2 drinks a day for men. ? Be aware of how much alcohol is in your drink. In the [place] one drink equals one 12 oz bottle of beer (355 mL), one 5 oz glass of wine (148 mL), or one 1 oz glass of hard liquor (44 mL). Lifestyle   Work with your health care provider to maintain a healthy body weight or to lose weight. Ask what an ideal weight is for you.  Get at least 30 minutes of exercise most days of the week. Activities may include walking, swimming, or biking.  Include exercise to strengthen your muscles (resistance exercise), such as Pilates or lifting weights, as part of your weekly exercise routine. Try to do these types of exercises for 30 minutes at least 3 days a week.  Do not use any products that contain nicotine or tobacco, such as cigarettes, e-cigarettes, and chewing tobacco. If you need help quitting, ask your health care provider.  Monitor your blood pressure at home as told by your health care provider.  Keep all follow-up visits as told by your health care provider. This is important. Medicines  Take over-the-counter and prescription medicines only as told by your health care provider. Follow directions carefully. Blood pressure medicines must be taken as prescribed.  Do not skip doses of blood pressure medicine. Doing this puts you at risk for problems and can make the medicine less effective.  Ask your health care provider about side effects or reactions to medicines that you should watch for. Contact a health care provider if you:  Think you are having a reaction to a medicine you are taking.  Have headaches that keep coming back (recurring).  Feel dizzy.  Have swelling in your ankles.  Have trouble with your vision. Get help right away if you:  Develop a severe headache  or confusion.  Have unusual weakness or numbness.  Feel faint.  Have severe pain in your chest or abdomen.  Vomit repeatedly.  Have  trouble breathing. Summary  Hypertension is when the force of blood pumping through your arteries is too strong. If this condition is not controlled, it may put you at risk for serious complications.  Your personal target blood pressure may vary depending on your medical conditions, your age, and other factors. For most people, a normal blood pressure is less than 120/80.  Hypertension is treated with lifestyle changes, medicines, or a combination of both. Lifestyle changes include losing weight, eating a healthy, low-sodium diet, exercising more, and limiting alcohol. This information is not intended to replace advice given to you by your health care provider. Make sure you discuss any questions you have with your health care provider. Document Released: 10/13/2005 Document Revised: 06/23/2018 Document Reviewed: 06/23/2018 Elsevier Patient Education  2020 Elsevier Inc.       Edwina Barth, MD [facility]

## 2019-08-25 NOTE — Telephone Encounter (Signed)
Medication: amLODipine (NORVASC) 5 MG tablet [[id number]] - Power was out - Can this script be resent please?  Has the patient contacted their pharmacy? Yes  (Agent: If no, request that the patient contact the pharmacy for the refill.) (Agent: If yes, when and what did the pharmacy advise?)  Preferred Pharmacy (with phone number or street name): [id number] - [place], [place] [phone] (Phone) [phone] (Fax)    Agent: Please be advised that RX refills may take up to 3 business days. We ask that you follow-up with your pharmacy.

## 2019-08-27 ENCOUNTER — Other Ambulatory Visit: Payer: Self-pay | Admitting: Emergency Medicine

## 2019-08-27 DIAGNOSIS — E785 Hyperlipidemia, unspecified: Secondary | ICD-10-CM

## 2019-08-27 MED ORDER — ROSUVASTATIN CALCIUM 20 MG PO TABS: 20.0000 mg | ORAL_TABLET | Freq: Every day | ORAL | 3 refills | Status: AC

## 2019-08-29 ENCOUNTER — Ambulatory Visit: Payer: BC Managed Care – PPO | Admitting: Diagnostic Neuroimaging

## 2019-08-29 ENCOUNTER — Other Ambulatory Visit: Payer: Self-pay

## 2019-08-29 ENCOUNTER — Telehealth: Payer: Self-pay | Admitting: *Deleted

## 2019-08-29 DIAGNOSIS — Z20822 Contact with and (suspected) exposure to covid-19: Secondary | ICD-10-CM

## 2019-08-29 NOTE — Telephone Encounter (Signed)
On 08/25/2019, faxed requistion for Cologuard to [facility]. Confirmation page 5:49 pm.

## 2019-08-30 LAB — NOVEL CORONAVIRUS, NAA: SARS-CoV-2, NAA: NOT DETECTED

## 2019-09-07 ENCOUNTER — Encounter: Payer: Self-pay | Admitting: Neurology

## 2019-09-07 ENCOUNTER — Ambulatory Visit (INDEPENDENT_AMBULATORY_CARE_PROVIDER_SITE_OTHER): Payer: BC Managed Care – PPO | Admitting: Neurology

## 2019-09-07 ENCOUNTER — Other Ambulatory Visit: Payer: Self-pay

## 2019-09-07 VITALS — BP 154/99 | HR 85 | Temp 97.5°F | Ht 68.0 in | Wt 169.0 lb

## 2019-09-07 DIAGNOSIS — R030 Elevated blood-pressure reading, without diagnosis of hypertension: Secondary | ICD-10-CM | POA: Diagnosis not present

## 2019-09-07 DIAGNOSIS — R0683 Snoring: Secondary | ICD-10-CM

## 2019-09-07 DIAGNOSIS — R351 Nocturia: Secondary | ICD-10-CM

## 2019-09-07 DIAGNOSIS — R200 Anesthesia of skin: Secondary | ICD-10-CM

## 2019-09-07 DIAGNOSIS — R519 Headache, unspecified: Secondary | ICD-10-CM

## 2019-09-07 DIAGNOSIS — R42 Dizziness and giddiness: Secondary | ICD-10-CM

## 2019-09-07 NOTE — Patient Instructions (Signed)
From the neurological standpoint, I do not have a good explanation for your dizziness.  Your neurological exam looks good today.  I am glad you are feeling better as well.  You do have risk factors for TIA and stroke.  Your description of having symptoms of numbness on the right side could point towards a small stroke, a TIA does not last more than a day or typically more than a few hours.Your blood pressure is still elevated.  Please monitor it at home, as I understand, your primary care physician also suggested that you monitor it.  Please continue with your cholesterol medicine as well as your baby aspirin and Your amlodipine.  Please follow-up with your primary care physician regarding blood pressure management.  I would like to proceed with a brain MRI with and without contrast as well as a sleep study to rule out sleep apnea as your airway is somewhat crowded and you have woken up with the occasional Headache and you do snore.Obstructive sleep apnea can be a contributor to blood pressure elevation and recurrent headaches and moderate to severe sleep apnea can cause long-term issues with the heart including congestive heart failure, irregular heartbeat and increased risk for stroke and dementia.

## 2019-09-07 NOTE — Progress Notes (Signed)
Subjective:    Patient ID: Alec Chavez is a 58 y.o. male.  HPI     Alec Foley, MD, PhD [facility] [address] [address] [place], [place] [address]  Dear Dr. Alvy Bimler, I saw your patient, Alec Chavez, upon your kind request in my neurologic clinic today for initial consultation of his dizziness as well as recent right-sided numbness.  The patient is unaccompanied today.  As you know, Alec Chavez is a 58 year old right-handed gentleman with an underlying medical history of hypertension, hyperlipidemia, arthritis of the shoulders, and borderline overweight state, who reports a approximately 6-day episode of feeling dizzy and off balance a couple of weeks ago.  Symptoms started at the end of October, he saw you for this on 08/25/2019.  He did not go to the emergency room even though he had one-sided numbness also, this affected his right face, arm and leg.  Symptoms to him started gradually but did not improve until about 5 or 6 days later.  He felt that he was leaning to the right side.  He did not actually have any weakness but felt that he was off when trying to walk, he did not really have good enough feeling to know where to put his foot so to speak.  He denies any vertiginous spells, no blurry vision, no double vision, no loss of vision, no actual weakness.  He did not have any droopy face or dysarthria.  He denies severe headaches but has woken up with a headache from time to time.  He was started on amlodipine as well as Crestor recently and was advised to take a baby aspirin.  He reports that he has been taking a baby aspirin for years.  He also reports that he was told to monitor his blood pressure but has not done so.  He feels back to baseline as far as the numbness is concerned, he feels still a little off balance.  He is a non-smoker.  He does smoke the occasional cigar however.  He drinks alcohol occasionally, typically on the weekends, no caffeine on a  day-to-day basis.  He does not have a family history of stroke but his brother had a heart attack and his sister had a DVT and perhaps a PE even.  He snores, he has nocturia about twice per average night, he has woken up with the occasional dull headache.  He lives with his wife, 1 dog in the household, no children.  He goes to bed between 930 and 10 and rise time is at 430.  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  His Past Medical History Is Significant For: Past Medical History:  Diagnosis Date  . Arthritis    Shoulders B  . Hyperlipidemia     His Past Surgical History Is Significant For: Past Surgical History:  Procedure Laterality Date  . ARTHOSCOPIC ROTAOR CUFF REPAIR     right shoulder    His Family History Is Significant For: Family History  Problem Relation Age of Onset  . Heart disease Mother 43       AMI  . Arthritis Father        DDD lumbar spine  . Cancer Neg Hx   . Hyperlipidemia Neg Hx   . Hypertension Neg Hx   . Diabetes Neg Hx     His Social History Is Significant For: Social History   Socioeconomic History  .  Marital status: Married    Spouse name: Not on file  . Number of children: Not on file  . Years of education: Not on file  . Highest education level: Not on file  Occupational History  . Occupation: Restaurant manager, fast food  Social Needs  . Financial resource strain: Not on file  . Food insecurity    Worry: Not on file    Inability: Not on file  . Transportation needs    Medical: Not on file    Non-medical: Not on file  Tobacco Use  . Smoking status: Never Smoker  . Smokeless tobacco: Never Used  Substance and Sexual Activity  . Alcohol use: Yes    Alcohol/week: 0.0 standard drinks  . Drug use: No  . Sexual activity: Yes  Lifestyle  . Physical activity    Days per week: Not on file    Minutes per session: Not on file  . Stress: Not on file  Relationships  . Social  Musician on phone: Not on file    Gets together: Not on file    Attends religious service: Not on file    Active member of club or organization: Not on file    Attends meetings of clubs or organizations: Not on file    Relationship status: Not on file  Other Topics Concern  . Not on file  Social History Narrative   Marital status: married since 01/2015; second marriage      Children: none      Lives: with wife      Employment: Restaurant manager, fast food for [employer]      Tobacco; none      Alcohol: 7 beers per week; one DUI in 1990s      Drugs: none      Exercise:  Physical job.      Seatbelt: 100% of time     Caffienated drinks-no      Smoke alarm in the home-yes     firearms/guns in the home-no   History of physical abuse-no             His Allergies Are:  Allergies  Allergen Reactions  . Codeine Nausea Only    Dizziness  :   His Current Medications Are:  Outpatient Encounter Medications as of 09/07/2019  Medication Sig  . amLODipine (NORVASC) 5 MG tablet Take 1 tablet (5 mg total) by mouth daily.  . Ascorbic Acid (VITAMIN C) 100 MG tablet Take 100 mg by mouth daily.  Marland Kitchen aspirin 81 MG chewable tablet Chew 81 mg by mouth daily.  Marland Kitchen Fe Bisgly-Succ-C-Thre-B12-FA (IRON-150 PO) Take by mouth.  . Multiple Vitamin (MULTI-VITAMIN DAILY PO) Take by mouth daily.  . naproxen sodium (ALEVE) 220 MG tablet Take 220 mg by mouth.  . Omega-3 Fatty Acids (FISH OIL) 1000 MG CPDR Take by mouth.  . rosuvastatin (CRESTOR) 20 MG tablet Take 1 tablet (20 mg total) by mouth daily.  . [DISCONTINUED] mometasone (NASONEX) 50 MCG/ACT nasal spray Place 2 sprays into the nose daily. (Patient not taking: Reported on 08/25/2019)  . [DISCONTINUED] traMADol (ULTRAM) 50 MG tablet Take 1 tablet (50 mg total) by mouth every 6 (six) hours as needed. (Patient not taking: Reported on 08/25/2019)   No facility-administered encounter medications on file as of 09/07/2019.   :  Review of Systems:  Out of  a complete 14 point review of systems, all are reviewed and negative with the exception of these symptoms as listed below: Review of Systems  Neurological:       Pt presents today to discuss a 2-3 day dizzy spell las week. He started feeling dizzy on Tuesday and it lasted until Thursday. He reports that the right side of his face felt different from the left. Pt does endorse snoring. Pt has never had a sleep study.  Epworth Sleepiness Scale 0= would never doze 1= slight chance of dozing 2= moderate chance of dozing 3= high chance of dozing  Sitting and reading: 0 Watching TV: 0 Sitting inactive in a public place (ex. Theater or meeting): 0 As a passenger in a car for an hour without a break: 0 Lying down to rest in the afternoon: 3 Sitting and talking to someone: 0 Sitting quietly after lunch (no alcohol): 0 In a car, while stopped in traffic: 0 Total: 3     Objective:  Neurological Exam  Physical Exam Physical Examination:   Vitals:   09/07/19 0928  BP: (!) 154/99  Pulse: 85  Temp: (!) 97.5 F (36.4 C)   On orthostatic testing, lying blood pressure 157/92 with a pulse of 83, sitting 154/99 with a pulse of 85, standing 147/100 with a pulse of 93.  He denies any lightheadedness or orthostatic dizziness, no vertiginous symptoms.   General Examination: The patient is a very pleasant 58 y.o. male in no acute distress. He appears well-developed and well-nourished and well groomed.   HEENT: Normocephalic, atraumatic, pupils are equal, round and reactive to light and accommodation. Funduscopic exam is normal with sharp disc margins noted. Extraocular tracking is good without limitation to gaze excursion or nystagmus noted,With the exception of endpoint nystagmus noted with gaze to the left, was not reproduced. Normal smooth pursuit is noted. Hearing is grossly intact. Face is symmetric with normal facial animation and normal facial sensation. Speech is clear with no dysarthria noted.  There is no hypophonia. There is no lip, neck/head, jaw or voice tremor. Neck is supple with full range of passive and active motion. There are no carotid bruits on auscultation. Oropharynx exam reveals: mild mouth dryness, adequate dental hygiene and moderate airway crowding, due to Small airway entry and redundant soft palate, uvula not fully visualized, tonsils about 1+ bilaterally, Mallampati class III.  Tongue protrudes centrally in palate elevates symmetrically.  Neck circumference is 16 inches.  Chest: Clear to auscultation without wheezing, rhonchi or crackles noted.  Heart: S1+S2+0, regular and normal without murmurs, rubs or gallops noted.   Abdomen: Soft, non-tender and non-distended with normal bowel sounds appreciated on auscultation.  Extremities: There is no pitting edema in the distal lower extremities bilaterally. Pedal pulses are intact.  Skin: Warm and dry without trophic changes noted. There are no varicose veins.  Musculoskeletal: exam reveals no obvious joint deformities, tenderness or joint swelling or erythema.   Neurologically:  Mental status: The patient is awake, alert and oriented in all 4 spheres. His immediate and remote memory, attention, language skills and fund of knowledge are appropriate. There is no evidence of aphasia, agnosia, apraxia or anomia. Speech is clear with normal prosody and enunciation. Thought process is linear. Mood is normal and affect is normal.  Cranial nerves II - XII are as described above under HEENT exam. In addition: shoulder shrug is normal with equal shoulder height noted. Motor exam: Normal bulk, strength and tone is noted. There is no drift, tremor or rebound. Romberg is negative. Reflexes are 2+ throughout. Babinski: Toes are flexor bilaterally. Fine motor skills and coordination: intact with normal finger taps, normal  hand movements, normal rapid alternating patting, normal foot taps and normal foot agility.  Cerebellar testing: No  dysmetria or intention tremor on finger to nose testing. Heel to shin is unremarkable bilaterally. There is no truncal or gait ataxia.  Sensory exam: intact to light touch, pinprick, vibration, temperature sense in the upper and lower extremities.  Gait, station and balance: He stands easily. No veering to one side is noted. No leaning to one side is noted. Posture is age-appropriate and stance is narrow based. Gait shows normal stride length and normal pace. No problems turning are noted. Tandem walk is unremarkable.   Assessment and Plan:  In summary, Chukwuma Rabinovich is a very pleasant 58 y.o.-year old male with an underlying medical history of hypertension, hyperlipidemia, arthritis of the shoulders, and borderline overweight state, who Presents for evaluation of his dizziness and episode of right-sided numbness.  Currently, his neurological exam is normal.  He has vascular risk factors including hypertension, hyperlipidemia, and possibly underlying sleep disordered breathing.  His dizziness is not well explained from the neurological standpoint, nevertheless, his blood pressure may be a contributor.  It is still elevated today.  He has not been monitoring his blood pressure at home, But reports taking the amlodipine and the cholesterol medicine and baby aspirin daily.  He is advised to proceed with additional testing.  It is possible that he had a small stroke, TIA does not last for several days, typically only hours and up to 24 hours.  He is advised that he should seek immediate medical attention should he have any one-sided numbness or weakness or droopy face or slurring of speech or severe headache. I would like to proceed with a brain MRI with and without contrast to rule out a structural lesion and to evaluate for the possibility of a small stroke.In addition, I would recommend a sleep study to rule out obstructive sleep apnea as he does snore and has a crowded airway, in addition, he has woken up with  the occasional headache and has nocturia on a night to night basis.I explained to him the possible connection between OSA and hypertension and cardiovascular disease. We also talked about secondary prevention.  He is advised to continue to monitor his blood pressure and follow-up with you in that regard.  He is advised to continue with all his medications for now.We will be in touch by phone regarding his MRI and sleep study results and I afterwards.  I answered all his questions today and he was in agreement.  Thank you very much for allowing me to participate in the care of this nice patient. If I can be of any further assistance to you please do not hesitate to call me at [phone].  Sincerely,   Star Age, MD, PhD

## 2019-09-13 ENCOUNTER — Telehealth: Payer: Self-pay

## 2019-09-13 NOTE — Telephone Encounter (Signed)
I called [facility], they requested I send fax to them with the order and they will call patient to schedule. DW

## 2019-09-14 NOTE — Telephone Encounter (Signed)
[facility] called in and stated they need a signed order, the previous order wasn't signed

## 2019-09-15 ENCOUNTER — Telehealth: Payer: Self-pay | Admitting: Neurology

## 2019-09-15 NOTE — Telephone Encounter (Signed)
Alec Chavez was this the one you resent for me?

## 2019-09-15 NOTE — Telephone Encounter (Signed)
Patient called stating that he was told by the MRI scheduler that they could not see him due to the insurance they were given he was listed as the subscriber/policy hold but he is dependant on his wife insurance and she is the Agricultural engineer. Patient states he has his own insurance as well. Please follow up.

## 2019-09-15 NOTE — Telephone Encounter (Signed)
Yes

## 2019-09-15 NOTE — Telephone Encounter (Signed)
I have already called and left this patient a VM, awaiting a call back. DW

## 2019-09-29 ENCOUNTER — Telehealth: Payer: Self-pay

## 2019-09-29 NOTE — Telephone Encounter (Signed)
We have attempted to call the patient three times to schedule sleep study.  Patient has been unavailable at the phone numbers we have on file and has not returned our calls.  At this point we will send a letter asking patient to please contact the sleep lab to schedule their sleep study.  If patient calls back we will schedule them for their sleep study.  

## 2019-10-25 ENCOUNTER — Ambulatory Visit: Payer: BC Managed Care – PPO | Admitting: Diagnostic Neuroimaging

## 2019-10-25 ENCOUNTER — Encounter

## 2019-10-26 ENCOUNTER — Other Ambulatory Visit: Payer: Self-pay

## 2019-10-26 ENCOUNTER — Ambulatory Visit (INDEPENDENT_AMBULATORY_CARE_PROVIDER_SITE_OTHER): Payer: BC Managed Care – PPO

## 2019-10-26 DIAGNOSIS — R519 Headache, unspecified: Secondary | ICD-10-CM

## 2019-10-26 DIAGNOSIS — R0683 Snoring: Secondary | ICD-10-CM

## 2019-10-26 DIAGNOSIS — R200 Anesthesia of skin: Secondary | ICD-10-CM

## 2019-10-26 DIAGNOSIS — R42 Dizziness and giddiness: Secondary | ICD-10-CM | POA: Diagnosis not present

## 2019-10-26 DIAGNOSIS — R030 Elevated blood-pressure reading, without diagnosis of hypertension: Secondary | ICD-10-CM

## 2019-10-26 DIAGNOSIS — R351 Nocturia: Secondary | ICD-10-CM

## 2019-10-26 MED ORDER — GADOBENATE DIMEGLUMINE 529 MG/ML IV SOLN
15.0000 mL | Freq: Once | INTRAVENOUS | Status: AC | PRN
  Administered 2019-10-26: 15 mL via INTRAVENOUS

## 2019-10-31 NOTE — Progress Notes (Signed)
Brain MRI with and without contrast showed no obvious reason for headaches or numbness. No acute findings were seen and contrast uptake was normal.  Please update patient.

## 2020-05-22 IMAGING — MR MR SHOULDER*L* W/O CM
4 of 5 series · 20 of 40 positions shown · non-contrast
Comparison: Left shoulder x-rays dated June 03, 2016. Left
shoulder MRI dated September 07, 2005.

CLINICAL DATA: Chronic shoulder pain.

EXAM:
MRI OF THE LEFT SHOULDER WITHOUT CONTRAST
TECHNIQUE: Multiplanar, multisequence MR imaging of the shoulder was performed.
No intravenous contrast was administered.

[Series 6: PD fat-sat · axial · left · 4.0mm · 0.44mm/px · z∈[-56,+59]mm · 8 of 25 slices shown (1 of 2)]
[im 1/25]
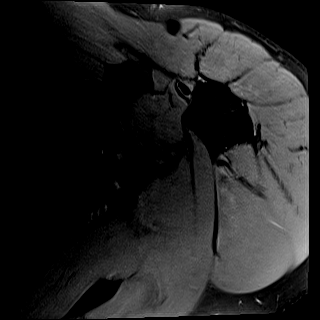
[im 4/25]
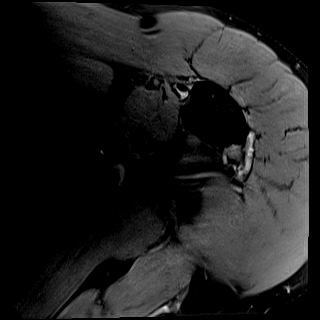
[im 7/25]
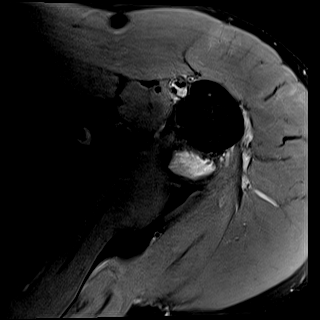
[im 11/25]
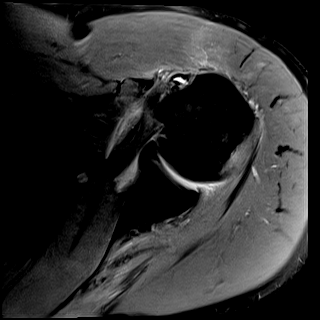
[im 14/25]
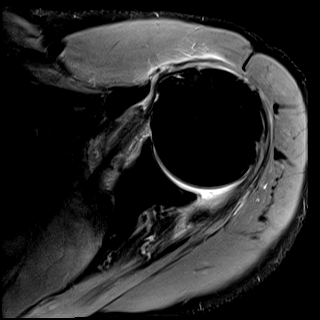
[im 18/25]
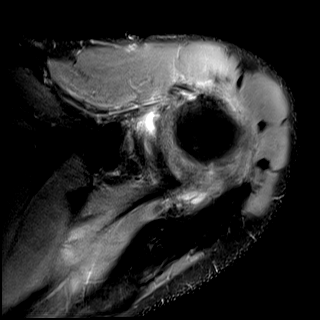
[im 21/25]
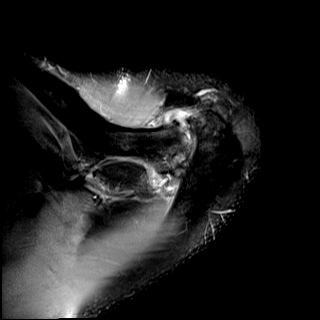
[im 25/25]
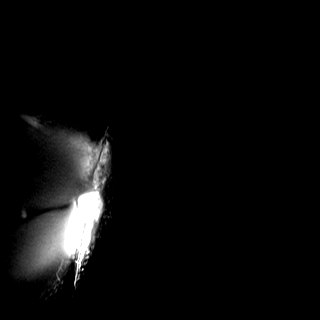

[Series 8: PD fat-sat · oblique · left · 4.0mm · 0.22mm/px · 6 of 21 slices shown (2 of 2)]
[im 1/21]
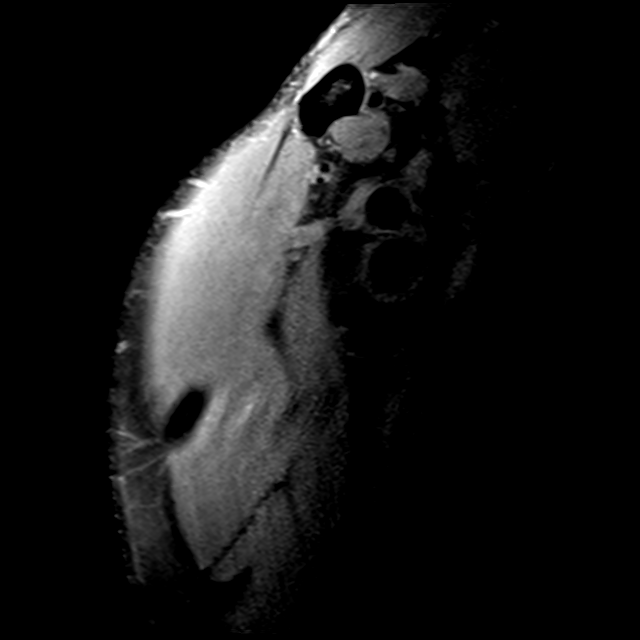
[im 4/21]
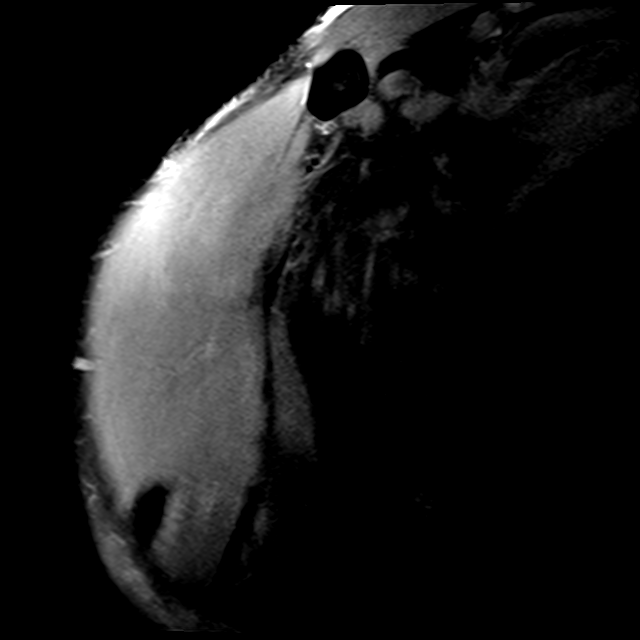
[im 7/21]
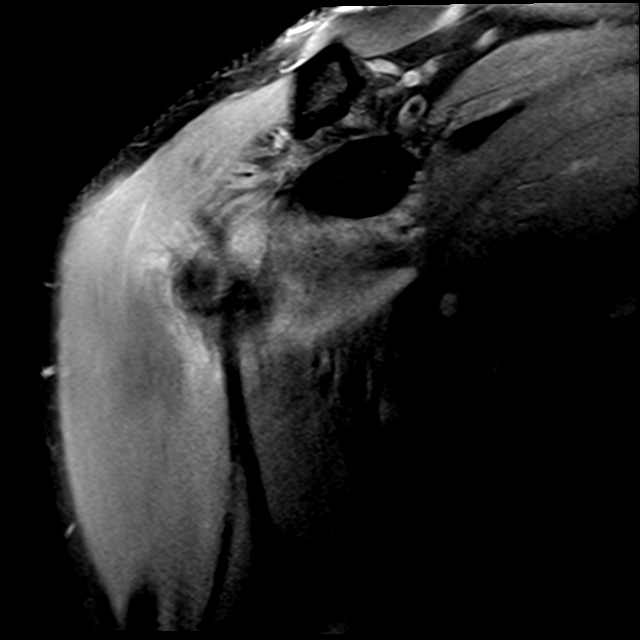
[im 11/21]
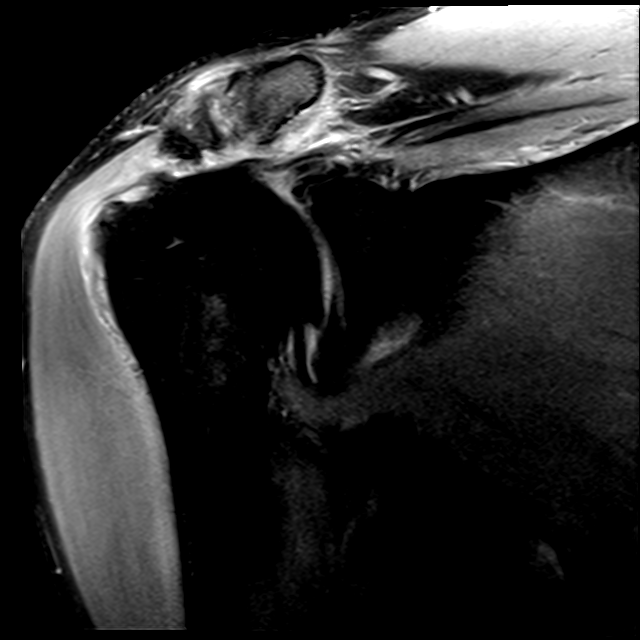
[im 14/21]
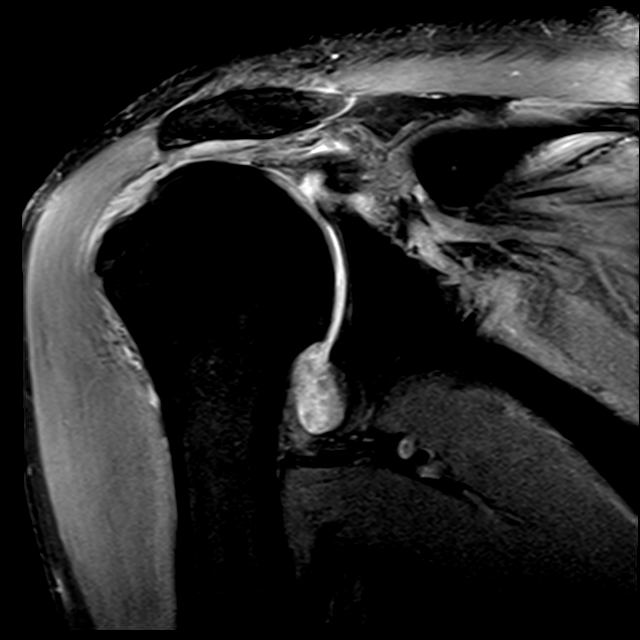
[im 17/21]
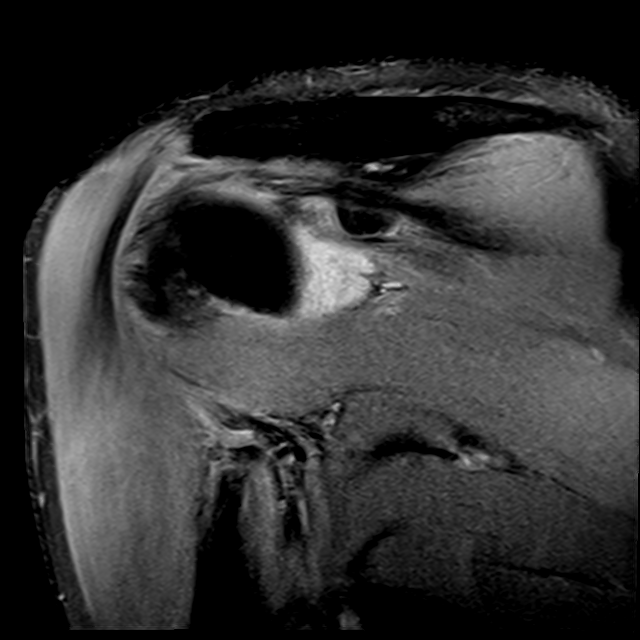

[Series 9: T2 fat-sat · oblique · left · 4.0mm · 0.22mm/px · 3 of 21 slices shown (1 of 2)]
[im 4/21]
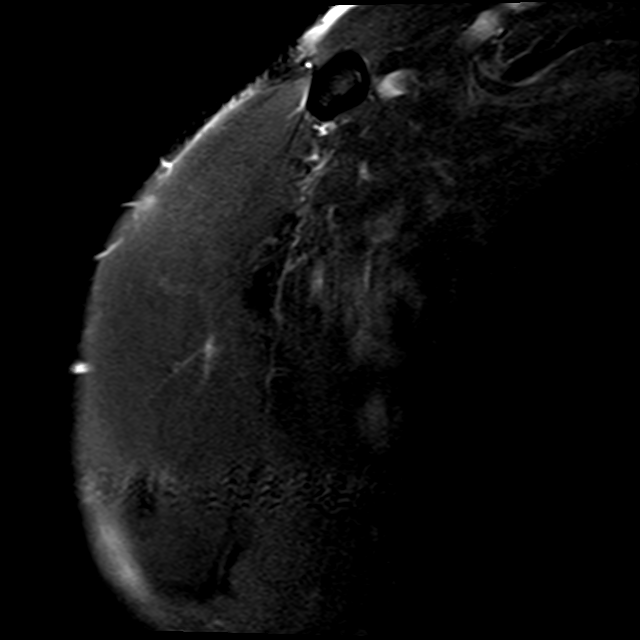
[im 11/21]
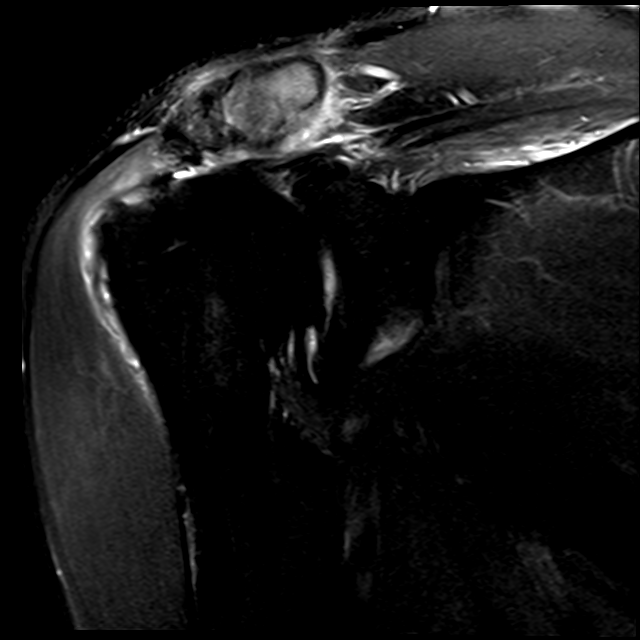
[im 17/21]
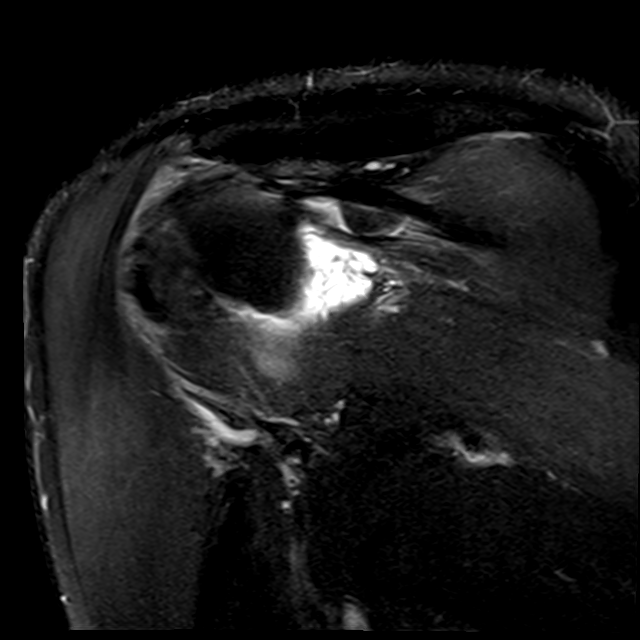

[Series 11: T2 fat-sat · oblique · left · 4.0mm · 0.44mm/px · 3 of 30 slices shown (2 of 2)]
[im 4/30]
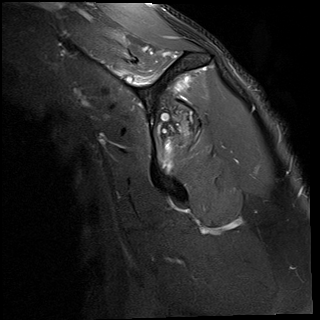
[im 15/30]
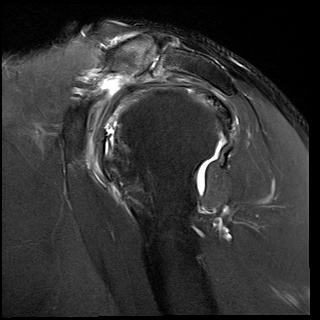
[im 26/30]
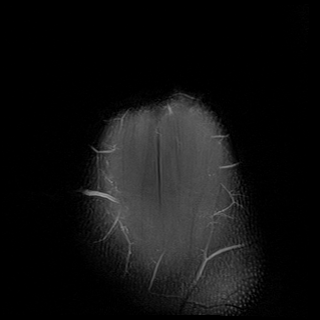

[20 of 40 positions shown; findings below may reference images not displayed]

FINDINGS: Rotator cuff: Progressive full-thickness, essentially full width
tear of the supraspinatus tendon with 3.9 cm retraction to the
medial humeral head. Moderate infraspinatus tendinosis with
interstitial tear and focal fat of the myotendinous junction. Focal
high-grade near full-thickness tear of the distal subscapularis
tendon with bursal and articular surface components. The teres minor
tendon is intact.

Muscles:  Mild infraspinatus muscle atrophy.

Biceps long head: The intra-articular portion is medially dislocated
with severe tendinosis. Split tear of the tendon within the
bicipital groove.

Acromioclavicular Joint: Moderate arthropathy of the
acromioclavicular joint. Type III acromion. Small
subacromial/subdeltoid bursal fluid.

Glenohumeral Joint: Small joint effusion with synovitis. Mild
glenohumeral degenerative changes without focal chondral defect.

Labrum:  Diffusely degenerated with blunting of the superior labrum.

Bones: High-riding humeral head. No acute fracture or dislocation.
No focal bone lesion.

Other: None.
IMPRESSION: 1. Progressive full-thickness, now essentially full width tear of
the supraspinatus tendon with 3.9 cm retraction. No muscle atrophy.
2. Focal high-grade near full-thickness tear of the distal
subscapularis tendon with bursal and articular surface components.
3. Moderate infraspinatus tendinosis with chronic interstitial
myotendinous junction tear. Mild infraspinatus muscle atrophy.
4. Medial dislocation of the intra-articular biceps tendon with
severe tendinosis. Split tear of the tendon within the bicipital
groove.
5. Moderate acromioclavicular and mild glenohumeral osteoarthritis.

## 2020-09-04 ENCOUNTER — Other Ambulatory Visit: Payer: Self-pay | Admitting: Emergency Medicine

## 2020-09-04 DIAGNOSIS — E785 Hyperlipidemia, unspecified: Secondary | ICD-10-CM

## 2020-09-04 DIAGNOSIS — I10 Essential (primary) hypertension: Secondary | ICD-10-CM

## 2020-09-04 NOTE — Telephone Encounter (Signed)
Pt needs OV over 1 year. Once scheduled curtesy refill will be sent

## 2020-09-04 NOTE — Telephone Encounter (Signed)
Requested medications are due for refill today?  Yes   Requested medications are on active medication list?  Yes  Last Refill:    Amlodipine  08/25/2019  # 90 with 3 refills Rosuvastatin  08/27/2019  # 90 with 3 refills   Future visit scheduled?   No   Notes to Clinic:  Medications failed RX refill protocol due to no valid encounter in the past 6 months (Amlodipine) nor past 12 months  (Rosuvastatin) and no labs in the past 365 days.    The last office visit and labs were on 08/25/2019.

## 2020-09-06 NOTE — Telephone Encounter (Signed)
09/06/2020 - PATIENT REQUESTING A REFILL ON HIS AMLODIPINE AND ROSUVASTATIN. I TRIED TO CALL AND GET HIM ESTABLISHED WITH A PROVIDER BUT HAD TO LEAVE A VOICE MAIL TO RETURN MY CALL. I WILL NOT ROUTE BACK TO THE CLINICAL TEAM UNTIL THE APPOINTMENT HAS BEEN MADE. THEN MACKENZIE WILL SEND IN COURTESY REFILLS. MBC

## 2020-09-11 ENCOUNTER — Ambulatory Visit: Payer: PRIVATE HEALTH INSURANCE | Admitting: Registered Nurse

## 2020-09-26 ENCOUNTER — Ambulatory Visit: Payer: PRIVATE HEALTH INSURANCE | Admitting: Registered Nurse

## 2021-11-29 DIAGNOSIS — Z125 Encounter for screening for malignant neoplasm of prostate: Secondary | ICD-10-CM | POA: Diagnosis not present

## 2021-11-29 DIAGNOSIS — Z13220 Encounter for screening for lipoid disorders: Secondary | ICD-10-CM | POA: Diagnosis not present

## 2021-11-29 DIAGNOSIS — Z0000 Encounter for general adult medical examination without abnormal findings: Secondary | ICD-10-CM | POA: Diagnosis not present

## 2022-05-23 DIAGNOSIS — S7001XA Contusion of right hip, initial encounter: Secondary | ICD-10-CM | POA: Diagnosis not present

## 2022-05-23 DIAGNOSIS — M79675 Pain in left toe(s): Secondary | ICD-10-CM | POA: Diagnosis not present

## 2022-05-23 DIAGNOSIS — M25551 Pain in right hip: Secondary | ICD-10-CM | POA: Diagnosis not present

## 2022-05-23 DIAGNOSIS — R030 Elevated blood-pressure reading, without diagnosis of hypertension: Secondary | ICD-10-CM | POA: Diagnosis not present

## 2022-07-02 DIAGNOSIS — S7001XA Contusion of right hip, initial encounter: Secondary | ICD-10-CM | POA: Diagnosis not present

## 2022-07-02 DIAGNOSIS — M25551 Pain in right hip: Secondary | ICD-10-CM | POA: Diagnosis not present

## 2022-08-21 DIAGNOSIS — M25551 Pain in right hip: Secondary | ICD-10-CM | POA: Diagnosis not present

## 2022-08-21 DIAGNOSIS — M5431 Sciatica, right side: Secondary | ICD-10-CM | POA: Diagnosis not present

## 2022-08-26 DIAGNOSIS — M25551 Pain in right hip: Secondary | ICD-10-CM | POA: Diagnosis not present

## 2022-09-08 DIAGNOSIS — S46812A Strain of other muscles, fascia and tendons at shoulder and upper arm level, left arm, initial encounter: Secondary | ICD-10-CM | POA: Diagnosis not present

## 2022-09-22 DIAGNOSIS — M25552 Pain in left hip: Secondary | ICD-10-CM | POA: Diagnosis not present

## 2022-09-22 DIAGNOSIS — M25511 Pain in right shoulder: Secondary | ICD-10-CM | POA: Diagnosis not present

## 2022-09-22 DIAGNOSIS — M25512 Pain in left shoulder: Secondary | ICD-10-CM | POA: Diagnosis not present

## 2022-09-24 DIAGNOSIS — M25512 Pain in left shoulder: Secondary | ICD-10-CM | POA: Diagnosis not present

## 2022-09-24 DIAGNOSIS — K4090 Unilateral inguinal hernia, without obstruction or gangrene, not specified as recurrent: Secondary | ICD-10-CM | POA: Diagnosis not present

## 2022-09-25 ENCOUNTER — Other Ambulatory Visit: Payer: Self-pay | Admitting: Sports Medicine

## 2022-09-25 DIAGNOSIS — M25552 Pain in left hip: Secondary | ICD-10-CM

## 2022-10-23 DIAGNOSIS — K429 Umbilical hernia without obstruction or gangrene: Secondary | ICD-10-CM | POA: Diagnosis not present

## 2022-10-23 DIAGNOSIS — K4090 Unilateral inguinal hernia, without obstruction or gangrene, not specified as recurrent: Secondary | ICD-10-CM | POA: Diagnosis not present

## 2022-10-30 DIAGNOSIS — M109 Gout, unspecified: Secondary | ICD-10-CM | POA: Diagnosis not present

## 2022-11-13 DIAGNOSIS — E790 Hyperuricemia without signs of inflammatory arthritis and tophaceous disease: Secondary | ICD-10-CM | POA: Diagnosis not present

## 2022-11-13 DIAGNOSIS — M109 Gout, unspecified: Secondary | ICD-10-CM | POA: Diagnosis not present

## 2022-11-13 DIAGNOSIS — Z8739 Personal history of other diseases of the musculoskeletal system and connective tissue: Secondary | ICD-10-CM | POA: Diagnosis not present

## 2022-12-26 DIAGNOSIS — K4090 Unilateral inguinal hernia, without obstruction or gangrene, not specified as recurrent: Secondary | ICD-10-CM | POA: Diagnosis not present

## 2022-12-26 DIAGNOSIS — K429 Umbilical hernia without obstruction or gangrene: Secondary | ICD-10-CM | POA: Diagnosis not present

## 2022-12-26 HISTORY — PX: HERNIA REPAIR: SHX51

## 2023-01-07 DIAGNOSIS — M109 Gout, unspecified: Secondary | ICD-10-CM | POA: Diagnosis not present

## 2023-04-22 DIAGNOSIS — R202 Paresthesia of skin: Secondary | ICD-10-CM | POA: Diagnosis not present

## 2023-06-01 ENCOUNTER — Encounter: Payer: Self-pay | Admitting: Neurology

## 2023-06-19 NOTE — Progress Notes (Deleted)
Initial neurology clinic note  Reason for Evaluation: Consultation requested by Alec Quan, DO for an opinion regarding Left shoulder pain with numbness and tingling from neck into upper left arm. My final recommendations will be communicated back to the requesting physician by way of shared medical record or letter to requesting physician via [place] mail.  HPI: This is [name], a 62 y.o. ***-handed male with a medical history of *** who presents to neurology clinic with the chief complaint of ***. The patient is accompanied by ***.  *** Persistent left shoulder pain since 03/2023 Numbness and tingling from neck into left arm Symptoms may have started after hernia surgery in 12/2022  Very abnormal MRI left shoulder in 2020***  Of note, patient saw Alec Chavez in 2020 for dizziness and right sided numbness. Exam and MRI brain was normal at that time.  The patient has not*** had similar episodes of symptoms in the past. ***  Muscle bulk loss? *** Muscle pain? ***  Cramps/Twitching? *** Suggestion of myotonia/difficulty relaxing after contraction? ***  Fatigable weakness?*** Does strength improve after brief exercise?***  Able to brush hair/teeth without difficulty? *** Able to button shirts/use zips? *** Clumsiness/dropping grasped objects?*** Can you arise from squatted position easily? *** Able to get out of chair without using arms? *** Able to walk up steps easily? *** Use an assistive device to walk? *** Significant imbalance with walking? *** Falls?*** Any change in urine color, especially after exertion/physical activity? ***  The patient denies*** symptoms suggestive of oculobulbar weakness including diplopia, ptosis, dysphagia, poor saliva control, dysarthria/dysphonia, impaired mastication, facial weakness/droop.  There are no*** neuromuscular respiratory weakness symptoms, particularly orthopnea>dyspnea.   Pseudobulbar affect is absent***.  The patient does not*** report  symptoms referable to autonomic dysfunction including impaired sweating, heat or cold intolerance, excessive mucosal dryness, gastroparetic early satiety, postprandial abdominal bloating, constipation, bowel or bladder dyscontrol, erectile dysfunction*** or syncope/presyncope/orthostatic intolerance.  There are no*** complaints relating to other symptoms of small fiber modalities including paresthesia/pain.  The patient has not *** noticed any recent skin rashes nor does he*** report any constitutional symptoms like fever, night sweats, anorexia or unintentional weight loss.  EtOH use: ***  Restrictive diet? *** Family history of neuropathy/myopathy/NM disease?***  Previous labs, electrodiagnostics, and neuroimaging are summarized below, but pertinent findings include***  Any biopsy done? *** Current medications being tried for the patient's symptoms include ***  Prior medications that have been tried: ***   MEDICATIONS:  Outpatient Encounter Medications as of 07/01/2023  Medication Sig Note   amLODipine (NORVASC) 5 MG tablet Take 1 tablet (5 mg total) by mouth daily.    Ascorbic Acid (VITAMIN C) 100 MG tablet Take 100 mg by mouth daily.    aspirin 81 MG chewable tablet Chew 81 mg by mouth daily.    Fe Bisgly-Succ-C-Thre-B12-FA (IRON-150 PO) Take by mouth.    Multiple Vitamin (MULTI-VITAMIN DAILY PO) Take by mouth daily. 08/25/2019: Centrum Silver   naproxen sodium (ALEVE) 220 MG tablet Take 220 mg by mouth. 08/25/2019: prn   Omega-3 Fatty Acids (FISH OIL) 1000 MG CPDR Take by mouth.    rosuvastatin (CRESTOR) 20 MG tablet Take 1 tablet (20 mg total) by mouth daily.    No facility-administered encounter medications on file as of 07/01/2023.    PAST MEDICAL HISTORY: Past Medical History:  Diagnosis Date   Arthritis    Shoulders B   Hyperlipidemia     PAST SURGICAL HISTORY: Past Surgical History:  Procedure Laterality Date  ARTHOSCOPIC ROTAOR CUFF REPAIR     right shoulder     ALLERGIES: Allergies  Allergen Reactions   Codeine Nausea Only    Dizziness    FAMILY HISTORY: Family History  Problem Relation Age of Onset   Heart disease Mother 39       AMI   Arthritis Father        DDD lumbar spine   Cancer Neg Hx    Hyperlipidemia Neg Hx    Hypertension Neg Hx    Diabetes Neg Hx     SOCIAL HISTORY: Social History   Tobacco Use   Smoking status: Never   Smokeless tobacco: Never  Substance Use Topics   Alcohol use: Yes    Alcohol/week: 0.0 standard drinks of alcohol   Drug use: No   Social History   Social History Narrative   Marital status: married since 01/2015; second marriage      Children: none      Lives: with wife      Employment: Restaurant manager, fast food for [employer]      Tobacco; none      Alcohol: 7 beers per week; one DUI in 1990s      Drugs: none      Exercise:  Physical job.      Seatbelt: 100% of time     Caffienated drinks-no      Smoke alarm in the home-yes     firearms/guns in the home-no   History of physical abuse-no              OBJECTIVE: PHYSICAL EXAM: There were no vitals taken for this visit.  General:*** General appearance: Awake and alert. No distress. Cooperative with exam.  Skin: No obvious rash or jaundice. HEENT: Atraumatic. Anicteric. Lungs: Non-labored breathing on room air  Heart: Regular Abdomen: Soft, non tender. Extremities: No edema. No obvious deformity.  Musculoskeletal: No obvious joint swelling. Psych: Affect appropriate.  Neurological: Mental Status: Alert. Speech fluent. No pseudobulbar affect Cranial Nerves: CNII: No RAPD. Visual fields grossly intact. CNIII, IV, VI: PERRL. No nystagmus. EOMI. CN V: Facial sensation intact bilaterally to fine touch. [place] clench strong. Jaw jerk***. CN VII: Facial muscles symmetric and strong. No ptosis at rest or after sustained upgaze***. CN VIII: Hearing grossly intact bilaterally. CN IX: No hypophonia. CN X: Palate elevates  symmetrically. CN XI: Full strength shoulder shrug bilaterally. CN XII: Tongue protrusion full and midline. No atrophy or fasciculations. No significant dysarthria*** Motor: Tone is ***. *** fasciculations in *** extremities. *** atrophy. No grip or percussive myotonia.***  Individual muscle group testing (MRC grade out of 5):  Movement     Neck flexion ***    Neck extension ***     Right Left   Shoulder abduction *** ***   Shoulder adduction *** ***   Shoulder ext rotation *** ***   Shoulder int rotation *** ***   Elbow flexion *** ***   Elbow extension *** ***   Wrist extension *** ***   Wrist flexion *** ***   Finger abduction - FDI *** ***   Finger abduction - ADM *** ***   Finger extension *** ***   Finger distal flexion - 2/3 *** ***   Finger distal flexion - 4/5 *** ***   Thumb flexion - FPL *** ***   Thumb abduction - APB *** ***    Hip flexion *** ***   Hip extension *** ***   Hip adduction *** ***   Hip abduction *** ***  Knee extension *** ***   Knee flexion *** ***   Dorsiflexion *** ***   Plantarflexion *** ***   Inversion *** ***   Eversion *** ***   Great toe extension *** ***   Great toe flexion *** ***     Reflexes:  Right Left   Bicep *** ***   Tricep *** ***   BrRad *** ***   Knee *** ***   Ankle *** ***    Pathological Reflexes: Babinski: *** response bilaterally*** Hoffman: *** Troemner: *** Pectoral: *** Palmomental: *** Facial: *** Midline tap: *** Sensation: Pinprick: *** Vibration: *** Temperature: *** Proprioception: *** Coordination: Intact finger-to- nose-finger bilaterally. Romberg negative.*** Gait: Able to rise from chair with arms crossed unassisted. Normal, narrow-based gait. Able to tandem walk. Able to walk on toes and heels.***  Lab and Test Review: Internal labs: 08/25/2019: HbA1c 5.5 CBC unremarkable CMP unremarkable Lipid panel: Component     Latest Ref Rng 08/25/2019  Cholesterol, Total     100 -  199 mg/dL 621 (H)   Triglycerides     0 - 149 mg/dL 308 (H)   HDL Cholesterol     >39 mg/dL 46   VLDL Cholesterol Cal     5 - 40 mg/dL 51 (H)   LDL Chol Calc (NIH)     0 - 99 mg/dL 657 (H)   Total CHOL/HDL Ratio     0.0 - 5.0 ratio 4.7    TSH (10/13/15) wnl  External labs: ***  Imaging: MRI brain w/wo contrast (10/26/2019): FINDINGS: On sagittal images, the spinal cord is imaged caudally to C3 and is normal in caliber.   The contents of the posterior fossa are of normal size and position.   The pituitary gland and optic chiasm appear normal.    Brain volume appears normal.   The ventricles are normal in size and without distortion.  There are no abnormal extra-axial collections of fluid.     The cerebellum and brainstem appears normal.   There is a T2 hyperintense focus in the lateral left thalamus and some scattered T2/FLAIR foci in the subcortical and deep white matter of the hemispheres.  None of these appear to be acute and they do not enhance.  Diffusion weighted images are normal.  Gradient echo heme weighted images are normal.     The orbits appear normal.   The VIIth/VIIIth nerve complex appears normal.  The mastoid air cells appear normal.  The paranasal sinuses appear normal.  Flow voids are identified within the major intracerebral arteries.      After the infusion of contrast material, a normal enhancement pattern is noted.     IMPRESSION: This MRI of the brain with and without contrast shows the following: 1.   Some scattered T2/flair hyperintense foci in the hemispheres with an appearance most consistent with chronic microvascular ischemic change and a very small chronic lacunar in the lateral left thalamus.  None of the foci appear to be acute. 2.    There is a normal enhancement pattern and there are no acute findings.  MRI left shoulder wo contrast (12/17/2018 for chronic shoulder pain): FINDINGS: Rotator cuff: Progressive full-thickness, essentially full width tear  of the supraspinatus tendon with 3.9 cm retraction to the medial humeral head. Moderate infraspinatus tendinosis with interstitial tear and focal fat of the myotendinous junction. Focal high-grade near full-thickness tear of the distal subscapularis tendon with bursal and articular surface components. The teres minor tendon is intact.   Muscles:  Mild infraspinatus muscle atrophy.   Biceps long head: The intra-articular portion is medially dislocated with severe tendinosis. Split tear of the tendon within the bicipital groove.   Acromioclavicular Joint: Moderate arthropathy of the acromioclavicular joint. Type III acromion. Small subacromial/subdeltoid bursal fluid.   Glenohumeral Joint: Small joint effusion with synovitis. Mild glenohumeral degenerative changes without focal chondral defect.   Labrum:  Diffusely degenerated with blunting of the superior labrum.   Bones: High-riding humeral head. No acute fracture or dislocation. No focal bone lesion.   Other: None.   IMPRESSION: 1. Progressive full-thickness, now essentially full width tear of the supraspinatus tendon with 3.9 cm retraction. No muscle atrophy. 2. Focal high-grade near full-thickness tear of the distal subscapularis tendon with bursal and articular surface components. 3. Moderate infraspinatus tendinosis with chronic interstitial myotendinous junction tear. Mild infraspinatus muscle atrophy. 4. Medial dislocation of the intra-articular biceps tendon with severe tendinosis. Split tear of the tendon within the bicipital groove. 5. Moderate acromioclavicular and mild glenohumeral osteoarthritis. ***  ASSESSMENT: Alec Chavez is a 62 y.o. male who presents for evaluation of ***. *** has a relevant medical history of ***. *** neurological examination is pertinent for ***. Available diagnostic data is significant for ***. This constellation of symptoms and objective data would most likely localize to ***.  ***  PLAN: -Blood work: *** ***  -Return to clinic ***  The impression above as well as the plan as outlined below were extensively discussed with the patient (in the company of ***) who voiced understanding. All questions were answered to their satisfaction.  The patient was counseled on pertinent fall precautions per the printed material provided today, and as noted under the "Patient Instructions" section below.***  When available, results of the above investigations and possible further recommendations will be communicated to the patient via telephone/MyChart. Patient to call office if not contacted after expected testing turnaround time.   Total time spent reviewing records, interview, history/exam, documentation, and coordination of care on day of encounter:  *** min   Thank you for allowing me to participate in patient's care.  If I can answer any additional questions, I would be pleased to do so.  Jacquelyne Balint, MD   CC: Patient, No Pcp Per No address on file  CC: Referring provider: Shireen Quan, DO [address] [place],  [place] [address]

## 2023-06-30 NOTE — Progress Notes (Signed)
Initial neurology clinic note  Reason for Evaluation: Consultation requested by Shireen Quan, DO for an opinion regarding left shoulder pain with numbness and tingling from neck into upper left arm. My final recommendations will be communicated back to the requesting physician by way of shared medical record or letter to requesting physician via [place] mail.  HPI: This is [name], a 62 y.o. right-handed male with a medical history of bilateral rotator cuff tendonitis who presents to neurology clinic with the chief complaint of abnormal sensation in left arm. The patient is alone today.  Patient had hernia surgery in 12/2022. Around 03/2023 he started feeling like a worm is crawling in his arm. If he turns his head to the right, the sensation will go away in about 20 seconds. It occurs every day and is occurring more now than at onset. He occasionally has numbness and tingling, but he states his arm does not hurt. He does endorse chronic weakness from bilateral bad shoulders. He had a very abnormal left shoulder MRI in 2020. He has seen ortho in the past. They wanted to do surgery, but patient didn't want it. He has not seen them recently. Despite having problems with right shoulder, he does not have the abnormal sensation in the right arm. He denies symptoms in his legs. He denies significant neck pain.  Of note, patient saw [facility] in 2020 for dizziness and right sided numbness. Exam and MRI brain was normal at that time.  He does not report any constitutional symptoms like fever, night sweats, anorexia or unintentional weight loss.  EtOH use: wine coolers, one per day; few more on weekends  Restrictive diet? No Family history of neuropathy/myopathy/neurologic disease? No   MEDICATIONS:  Outpatient Encounter Medications as of 07/09/2023  Medication Sig Note   Ascorbic Acid (VITAMIN C) 100 MG tablet Take 100 mg by mouth daily.    aspirin 81 MG chewable tablet Chew 81 mg by mouth daily.     Fe Bisgly-Succ-C-Thre-B12-FA (IRON-150 PO) Take by mouth.    Multiple Vitamin (MULTI-VITAMIN DAILY PO) Take by mouth daily. 08/25/2019: Centrum Silver   naproxen sodium (ALEVE) 220 MG tablet Take 220 mg by mouth. 08/25/2019: prn   Omega-3 Fatty Acids (FISH OIL) 1000 MG CPDR Take by mouth.    amLODipine (NORVASC) 5 MG tablet Take 1 tablet (5 mg total) by mouth daily. (Patient not taking: Reported on 07/09/2023)    rosuvastatin (CRESTOR) 20 MG tablet Take 1 tablet (20 mg total) by mouth daily. (Patient not taking: Reported on 07/09/2023)    No facility-administered encounter medications on file as of 07/09/2023.    PAST MEDICAL HISTORY: Past Medical History:  Diagnosis Date   Arthritis    Shoulders B   Hyperlipidemia     PAST SURGICAL HISTORY: Past Surgical History:  Procedure Laterality Date   ARTHOSCOPIC ROTAOR CUFF REPAIR     right shoulder   HERNIA REPAIR N/A 12/26/2022    ALLERGIES: Allergies  Allergen Reactions   Codeine Nausea Only    Dizziness    FAMILY HISTORY: Family History  Problem Relation Age of Onset   Heart disease Mother 80       AMI   Arthritis Father        DDD lumbar spine   Cancer Neg Hx    Hyperlipidemia Neg Hx    Hypertension Neg Hx    Diabetes Neg Hx     SOCIAL HISTORY: Social History   Tobacco Use   Smoking status: Some  Days    Types: Cigarettes, Cigars   Smokeless tobacco: Never   Tobacco comments:    On weekends  Vaping Use   Vaping status: Never Used  Substance Use Topics   Alcohol use: Yes    Alcohol/week: 0.0 standard drinks of alcohol   Drug use: No   Social History   Social History Narrative   Marital status: married since 01/2015; second marriage      Children: none      Lives: with wife      Employment: Restaurant manager, fast food for [employer]      Tobacco; none      Alcohol: 7 beers per week; one DUI in 1990s      Drugs: none      Exercise:  Physical job.      Seatbelt: 100% of time     Caffienated drinks-no      Smoke  alarm in the home-yes     firearms/guns in the home-no   History of physical abuse-no         Right hand     OBJECTIVE: PHYSICAL EXAM: BP (!) 160/86   Pulse 70   Ht 5\' 8"  (1.727 m)   Wt 170 lb (77.1 kg)   SpO2 97%   BMI 25.85 kg/m   General: General appearance: Awake and alert. No distress. Cooperative with exam.  Skin: No obvious rash or jaundice. HEENT: Atraumatic. Anicteric. Lungs: Non-labored breathing on room air  Extremities: No edema. No obvious deformity.  Musculoskeletal: No obvious joint swelling. Psych: Affect appropriate.  Neurological: Mental Status: Alert. Speech fluent. No pseudobulbar affect Cranial Nerves: CNII: No RAPD. Visual fields grossly intact. CNIII, IV, VI: PERRL. No nystagmus. EOMI. CN V: Facial sensation intact bilaterally to fine touch. CN VII: Facial muscles symmetric and strong. No ptosis at rest. CN VIII: Hearing grossly intact bilaterally. CN IX: No hypophonia. CN X: Palate elevates symmetrically. CN XI: Full strength shoulder shrug bilaterally. CN XII: Tongue protrusion full and midline. No atrophy or fasciculations. No significant dysarthria Motor: Tone is normal. No obvious scapular winging. No fasciculations in any extremities. No atrophy.  Individual muscle group testing (MRC grade out of 5):  Movement     Neck flexion 5    Neck extension 5     Right Left   Shoulder abduction 5- 5-   Shoulder adduction 5 5   Shoulder ext rotation 4+ 4+   Shoulder int rotation 5 4+   Elbow flexion 5 5   Elbow extension 5 5   Wrist extension 5 5   Wrist flexion 5 5   Finger abduction - FDI 5 5   Finger abduction - ADM 5 5   Finger extension 5 5   Finger distal flexion - 2/3 5 5    Finger distal flexion - 4/5 5 5    Thumb flexion - FPL 5 5   Thumb abduction - APB 5- 4+    Hip flexion 5 5   Hip extension 5 5   Hip adduction 5 5   Hip abduction 5 5   Knee extension 5 5   Knee flexion 5 5   Dorsiflexion 5 5   Plantarflexion 5 5      Reflexes:  Right Left   Bicep 2+ 2+   Tricep 2+ 2+   BrRad 2+ 2+   Knee 1+ 1+   Ankle 1+ 1+    Pathological Reflexes: Babinski: flexor response bilaterally Hoffman: absent bilaterally Troemner: absent bilaterally Sensation: Pinprick: intact in  all extremities Coordination: Intact finger-to-nose-finger bilaterally. Romberg negative. Gait: Able to rise from chair with arms crossed unassisted. Normal, narrow-based gait.  Lab and Test Review: Internal labs: 08/25/2019: HbA1c 5.5 CBC unremarkable CMP unremarkable Lipid panel: Component     Latest Ref Rng 08/25/2019  Cholesterol, Total     100 - 199 mg/dL 161 (H)   Triglycerides     0 - 149 mg/dL 096 (H)   HDL Cholesterol     >39 mg/dL 46   VLDL Cholesterol Cal     5 - 40 mg/dL 51 (H)   LDL Chol Calc (NIH)     0 - 99 mg/dL 045 (H)   Total CHOL/HDL Ratio     0.0 - 5.0 ratio 4.7    TSH (10/13/15) wnl  Imaging: MRI brain w/wo contrast (10/26/2019): FINDINGS: On sagittal images, the spinal cord is imaged caudally to C3 and is normal in caliber.   The contents of the posterior fossa are of normal size and position.   The pituitary gland and optic chiasm appear normal.    Brain volume appears normal.   The ventricles are normal in size and without distortion.  There are no abnormal extra-axial collections of fluid.     The cerebellum and brainstem appears normal.   There is a T2 hyperintense focus in the lateral left thalamus and some scattered T2/FLAIR foci in the subcortical and deep white matter of the hemispheres.  None of these appear to be acute and they do not enhance.  Diffusion weighted images are normal.  Gradient echo heme weighted images are normal.     The orbits appear normal.   The VIIth/VIIIth nerve complex appears normal.  The mastoid air cells appear normal.  The paranasal sinuses appear normal.  Flow voids are identified within the major intracerebral arteries.      After the infusion of contrast material,  a normal enhancement pattern is noted.     IMPRESSION: This MRI of the brain with and without contrast shows the following: 1.   Some scattered T2/flair hyperintense foci in the hemispheres with an appearance most consistent with chronic microvascular ischemic change and a very small chronic lacunar in the lateral left thalamus.  None of the foci appear to be acute. 2.    There is a normal enhancement pattern and there are no acute findings.  MRI left shoulder wo contrast (12/17/2018 for chronic shoulder pain): FINDINGS: Rotator cuff: Progressive full-thickness, essentially full width tear of the supraspinatus tendon with 3.9 cm retraction to the medial humeral head. Moderate infraspinatus tendinosis with interstitial tear and focal fat of the myotendinous junction. Focal high-grade near full-thickness tear of the distal subscapularis tendon with bursal and articular surface components. The teres minor tendon is intact.   Muscles:  Mild infraspinatus muscle atrophy.   Biceps long head: The intra-articular portion is medially dislocated with severe tendinosis. Split tear of the tendon within the bicipital groove.   Acromioclavicular Joint: Moderate arthropathy of the acromioclavicular joint. Type III acromion. Small subacromial/subdeltoid bursal fluid.   Glenohumeral Joint: Small joint effusion with synovitis. Mild glenohumeral degenerative changes without focal chondral defect.   Labrum:  Diffusely degenerated with blunting of the superior labrum.   Bones: High-riding humeral head. No acute fracture or dislocation. No focal bone lesion.   Other: None.   IMPRESSION: 1. Progressive full-thickness, now essentially full width tear of the supraspinatus tendon with 3.9 cm retraction. No muscle atrophy. 2. Focal high-grade near full-thickness tear of the distal  subscapularis tendon with bursal and articular surface components. 3. Moderate infraspinatus tendinosis with chronic  interstitial myotendinous junction tear. Mild infraspinatus muscle atrophy. 4. Medial dislocation of the intra-articular biceps tendon with severe tendinosis. Split tear of the tendon within the bicipital groove. 5. Moderate acromioclavicular and mild glenohumeral osteoarthritis.   ASSESSMENT: Alec Chavez is a 62 y.o. male who presents for evaluation of left shoulder paresthesias into the neck and upper arm. He has a relevant medical history of bilateral rotator cuff tendonitis. His neurological examination is pertinent for bilateral external rotation weakness and thumb abduction weakness. Tinel's test is positive at left carpal tunnel. Available diagnostic data is significant for MRI of left shoulder showing severe tears in the rotator cuff and biceps tendon. The etiology of the crawling sensation is unclear. There is no external manifestation of symptoms such as fasciculations (I observed during an episode and did not see). He has significant pathology of the left rotator cuff and biceps tendon, so certainly this is one possible source. While he appears to have bilateral carpal tunnel, I do not see other clear nerve issue in the left arm. I will send patient to ortho and get an EMG to clarify if he has CTS or any other nerve problem in left arm.  PLAN: -Orthopaedic referral for rotator cuff issues -EMG of bilateral upper extremities (CTS and possible cervical radiculopathy) -Recommend wrist splinting for likely carpal tunnel syndrome  -Return to clinic to be determined after EMG  The impression above as well as the plan as outlined below were extensively discussed with the patient who voiced understanding. All questions were answered to their satisfaction.  When available, results of the above investigations and possible further recommendations will be communicated to the patient via telephone/MyChart. Patient to call office if not contacted after expected testing turnaround time.   Total  time spent reviewing records, interview, history/exam, documentation, and coordination of care on day of encounter:  50 min   Thank you for allowing me to participate in patient's care.  If I can answer any additional questions, I would be pleased to do so.  Jacquelyne Balint, MD   CC: Patient, No Pcp Per No address on file  CC: Referring provider: Shireen Quan, DO [address] [place],  [place] [address]

## 2023-07-01 ENCOUNTER — Ambulatory Visit: Payer: Self-pay | Admitting: Neurology

## 2023-07-09 ENCOUNTER — Encounter: Payer: Self-pay | Admitting: Neurology

## 2023-07-09 ENCOUNTER — Ambulatory Visit (INDEPENDENT_AMBULATORY_CARE_PROVIDER_SITE_OTHER): Payer: PRIVATE HEALTH INSURANCE | Admitting: Neurology

## 2023-07-09 VITALS — BP 160/86 | HR 70 | Ht 68.0 in | Wt 170.0 lb

## 2023-07-09 DIAGNOSIS — G5603 Carpal tunnel syndrome, bilateral upper limbs: Secondary | ICD-10-CM | POA: Diagnosis not present

## 2023-07-09 DIAGNOSIS — M67911 Unspecified disorder of synovium and tendon, right shoulder: Secondary | ICD-10-CM

## 2023-07-09 DIAGNOSIS — R209 Unspecified disturbances of skin sensation: Secondary | ICD-10-CM | POA: Diagnosis not present

## 2023-07-09 DIAGNOSIS — R200 Anesthesia of skin: Secondary | ICD-10-CM | POA: Diagnosis not present

## 2023-07-09 DIAGNOSIS — R202 Paresthesia of skin: Secondary | ICD-10-CM

## 2023-07-09 DIAGNOSIS — M67912 Unspecified disorder of synovium and tendon, left shoulder: Secondary | ICD-10-CM

## 2023-07-09 NOTE — Patient Instructions (Signed)
I saw you today for the abnormal sensation in your left arm. I'm not sure the cause of your symptoms, but it could be related to your rotator cuff issues or a nerve issue.  I will send you to orthopaedics to evaluate your rotator cuff.  I will get a nerve test, called EMG (see more information below). We will discuss the results and anything we need to do after that.  You likely have a nerve pinched at your wrist called carpal tunnel syndrome as well. I recommend wrist braces:  Cock up wrist splint for carpal tunnel symptoms can be bought in local drug stores or online. This should especially be worn at night while sleeping.    The physicians and staff at [facility] are committed to providing excellent care. You may receive a survey requesting feedback about your experience at our office. We strive to receive "very good" responses to the survey questions. If you feel that your experience would prevent you from giving the office a "very good " response, please contact our office to try to remedy the situation. We may be reached at [phone]. Thank you for taking the time out of your busy day to complete the survey.  Jacquelyne Balint, MD [facility]  ELECTROMYOGRAM AND NERVE CONDUCTION STUDIES (EMG/NCS) INSTRUCTIONS  How to Prepare The neurologist conducting the EMG will need to know if you have certain medical conditions. Tell the neurologist and other EMG lab personnel if you: Have a pacemaker or any other electrical medical device Take blood-thinning medications Have hemophilia, a blood-clotting disorder that causes prolonged bleeding Bathing Take a shower or bath shortly before your exam in order to remove oils from your skin. Don't apply lotions or creams before the exam.  What to Expect You'll likely be asked to change into a hospital gown for the procedure and lie down on an examination table. The following explanations can help you understand what will happen during the  exam.  Electrodes. The neurologist or a technician places surface electrodes at various locations on your skin depending on where you're experiencing symptoms. Or the neurologist may insert needle electrodes at different sites depending on your symptoms.  Sensations. The electrodes will at times transmit a tiny electrical current that you may feel as a twinge or spasm. The needle electrode may cause discomfort or pain that usually ends shortly after the needle is removed. If you are concerned about discomfort or pain, you may want to talk to the neurologist about taking a short break during the exam.  Instructions. During the needle EMG, the neurologist will assess whether there is any spontaneous electrical activity when the muscle is at rest - activity that isn't present in healthy muscle tissue - and the degree of activity when you slightly contract the muscle.  He or she will give you instructions on resting and contracting a muscle at appropriate times. Depending on what muscles and nerves the neurologist is examining, he or she may ask you to change positions during the exam.  After your EMG You may experience some temporary, minor bruising where the needle electrode was inserted into your muscle. This bruising should fade within several days. If it persists, contact your primary care doctor.

## 2023-07-10 NOTE — Addendum Note (Signed)
Addended by: Lenise Herald on: 07/10/2023 08:45 AM   Modules accepted: Orders

## 2023-07-28 ENCOUNTER — Encounter: Payer: Self-pay | Admitting: Neurology

## 2023-07-29 DIAGNOSIS — M75121 Complete rotator cuff tear or rupture of right shoulder, not specified as traumatic: Secondary | ICD-10-CM | POA: Diagnosis not present

## 2023-07-29 DIAGNOSIS — M75122 Complete rotator cuff tear or rupture of left shoulder, not specified as traumatic: Secondary | ICD-10-CM | POA: Diagnosis not present

## 2023-08-17 ENCOUNTER — Telehealth: Payer: Self-pay | Admitting: Neurology

## 2023-08-17 ENCOUNTER — Encounter: Payer: Self-pay | Admitting: Neurology

## 2023-08-17 ENCOUNTER — Ambulatory Visit (INDEPENDENT_AMBULATORY_CARE_PROVIDER_SITE_OTHER): Payer: PRIVATE HEALTH INSURANCE | Admitting: Neurology

## 2023-08-17 DIAGNOSIS — R202 Paresthesia of skin: Secondary | ICD-10-CM

## 2023-08-17 DIAGNOSIS — G5603 Carpal tunnel syndrome, bilateral upper limbs: Secondary | ICD-10-CM

## 2023-08-17 DIAGNOSIS — M67911 Unspecified disorder of synovium and tendon, right shoulder: Secondary | ICD-10-CM

## 2023-08-17 DIAGNOSIS — R209 Unspecified disturbances of skin sensation: Secondary | ICD-10-CM

## 2023-08-17 NOTE — Procedures (Addendum)
[facility]  [address] [place], [address]  [place], [place] [address] Tel: [phone] Fax: [phone] Test Date:  08/17/2023  Patient: Alec Chavez DOB: 31-Jul-1961 Physician: Jacquelyne Balint, MD  Sex: Male Height: 5\' 8"  Ref Phys: Jacquelyne Balint, MD  ID#: [id number]   Technician:    History: This is a 62 year old male with left shoulder pain and numbness and tingling in neck into left upper limb.  NCV & EMG Findings: Extensive electrodiagnostic evaluation of bilateral upper limbs shows: Right median sensory response is absent. Left median-ulnar palmar sensory response shows prolonged distal peak latency (Median Palm-Wrist, 2.6 ms) and abnormal peak latency difference ((Median Palm-Wrist)-(Ulnar Palm-Wrist), 0.62 ms). Left median, bilateral ulnar, and bilateral radial responses are within normal limits. Right median (APB) motor response shows prolonged distal onset latency (6.5 ms). Left median (APB) and bilateral ulnar (ADM) motor responses are within normal limits. Chronic motor axon loss changes without accompanying active denervation changes are seen in the left triceps muscle. All other tested muscles are within normal limits with normal motor unit configuration and recruitment patterns.  Impression: This is an abnormal study. The findings are most consistent with the following: Evidence of bilateral median mononeuropathy at or distal to the wrist, consistent with carpal tunnel syndrome. The findings are severe in degree electrically on the right and very mild in degree electrically on the left. No definitive electrodiagnostic evidence of a right or left cervical (C5-C8) motor radiculopathy. Minimal chronic neurogenic changes isolated to the left triceps muscle are seen on needle examination, with no active or ongoing changes, that is of unclear significance. This finding is too limited in degree and distribution for diagnostic purposes. Screening studies for right or left ulnar or  radial mononeuropathies are normal.    ___________________________ Jacquelyne Balint, MD    Nerve Conduction Studies Motor Nerve Results    Latency Amplitude F-Lat Segment Distance CV Comment  Site (ms) Norm (mV) Norm (ms)  (cm) (m/s) Norm   Left Median (APB) Motor  Wrist 3.7  < 4.0 5.7  > 5.0        Elbow 9.2 - 5.3 -  Elbow-Wrist 31 56  > 50   Right Median (APB) Motor  Wrist *6.5  < 4.0 5.6  > 5.0        Elbow 12.2 - 5.6 -  Elbow-Wrist 29 51  > 50   Left Ulnar (ADM) Motor  Wrist 1.98  < 3.1 9.5  > 7.0        Bel elbow 6.0 - 9.2 -  Bel elbow-Wrist 24 60  > 50   Ab elbow 7.7 - 9.1 -  Ab elbow-Bel elbow 10 59 -   Right Ulnar (ADM) Motor  Wrist 1.95  < 3.1 9.6  > 7.0        Bel elbow 6.0 - 9.2 -  Bel elbow-Wrist 23 56  > 50   Ab elbow 7.8 - 9.1 -  Ab elbow-Bel elbow 10 56 -    Sensory Sites    Neg Peak Lat Amplitude (O-P) Segment Distance Velocity Comment  Site (ms) Norm (V) Norm  (cm) (ms)   Left Median Sensory  Wrist-Dig II 3.8  < 3.8 15  > 10 Wrist-Dig II 13    Right Median Sensory  Wrist-Dig II *NR  < 3.8 *NR  > 10 Wrist-Dig II 13    Left Median-Ulnar Palmar Sensory       Median  Palm-Wrist *2.6  < 2.2 14  >  10 Palm-Wrist 8         Ulnar  Palm-Wrist 1.98  < 2.2 15  > 5 Palm-Wrist 8    Left Radial Sensory  Forearm-Wrist 2.1  < 2.8 28  > 10 Forearm-Wrist 10    Right Radial Sensory  Forearm-Wrist 2.3  < 2.8 18  > 10 Forearm-Wrist 10    Left Ulnar Sensory  Wrist-Dig V 3.1  < 3.2 18  > 5 Wrist-Dig V 11    Right Ulnar Sensory  Wrist-Dig V 2.9  < 3.2 14  > 5 Wrist-Dig V 11     Inter-Nerve Comparisons   Nerve 1 Value 1 Nerve 2 Value 2 Parameter Result Normal  Sensory Sites  L Median Palm-Wrist 2.6 ms L Ulnar Palm-Wrist 1.98 ms Peak Lat Diff *0.62 ms <0.40   Electromyography   Side Muscle Ins.Act Fibs Fasc Recrt Amp Dur Poly Activation Comment  Right FDI Nml Nml Nml Nml Nml Nml Nml Nml N/A  Right EIP Nml Nml Nml Nml Nml Nml Nml Nml N/A  Right Pronator teres Nml Nml  Nml Nml Nml Nml Nml Nml N/A  Right Biceps Nml Nml Nml Nml Nml Nml Nml Nml N/A  Right Triceps Nml Nml Nml Nml Nml Nml Nml Nml N/A  Right Deltoid Nml Nml Nml Nml Nml Nml Nml Nml N/A  Left FDI Nml Nml Nml Nml Nml Nml Nml Nml N/A  Left EIP Nml Nml Nml Nml Nml Nml Nml Nml N/A  Left FPL Nml Nml Nml Nml Nml Nml Nml Nml N/A  Left Pronator teres Nml Nml Nml Nml Nml Nml Nml Nml N/A  Left Biceps Nml Nml Nml Nml Nml Nml Nml Nml N/A  Left Triceps Nml Nml Nml *1- *1+ *1+ *1+ Nml N/A  Left Deltoid Nml Nml Nml Nml Nml Nml Nml Nml N/A  Left C7 PSP Nml Nml Nml Nml Nml Nml Nml Nml N/A      Waveforms:  Motor           Sensory

## 2023-08-17 NOTE — Telephone Encounter (Signed)
Discussed the results of patient's EMG after the procedure today. It showed bilateral carpal tunnel syndrome, severe on the right and very mild on the left. There was no definitive evidence of other nerve damage affecting the left shoulder including radiculopathy. We discussed possible MRI cervical spine to be sure, but given patient is not having neck pain, he would like to defer.  I recommended wrist splints for CTS, which patient forgot about. He will try these and let me know if symptoms persist or worsen. If so, I will send to hand surgeon.   In terms of his shoulder, this is likely orthopedic in nature. He has seen ortho and per patient was told he would likely need to have a procedure or surgery on the shoulder. He did get an injection with some relief at that visit.  All questions were answered.  Jacquelyne Balint, MD [facility]
